# Patient Record
Sex: Male | Born: 1965 | Race: White | Hispanic: No | Marital: Married | Smoking: Former smoker
Health system: Southern US, Community
[De-identification: ages and names within clinical notes are randomized; demographics above are authoritative.]

## PROBLEM LIST (undated history)

## (undated) DIAGNOSIS — Z86010 Personal history of colonic polyps: Principal | ICD-10-CM

## (undated) DIAGNOSIS — T7840XA Allergy, unspecified, initial encounter: Secondary | ICD-10-CM

## (undated) DIAGNOSIS — L908 Other atrophic disorders of skin: Secondary | ICD-10-CM

## (undated) DIAGNOSIS — N138 Other obstructive and reflux uropathy: Secondary | ICD-10-CM

## (undated) DIAGNOSIS — K219 Gastro-esophageal reflux disease without esophagitis: Secondary | ICD-10-CM

## (undated) DIAGNOSIS — E559 Vitamin D deficiency, unspecified: Secondary | ICD-10-CM

## (undated) DIAGNOSIS — L918 Other hypertrophic disorders of the skin: Secondary | ICD-10-CM

## (undated) DIAGNOSIS — E785 Hyperlipidemia, unspecified: Secondary | ICD-10-CM

## (undated) DIAGNOSIS — G4730 Sleep apnea, unspecified: Secondary | ICD-10-CM

## (undated) HISTORY — DX: Sleep apnea, unspecified: G47.30

## (undated) HISTORY — DX: Hyperlipidemia, unspecified: E78.5

## (undated) HISTORY — DX: Allergy, unspecified, initial encounter: T78.40XA

## (undated) HISTORY — DX: Other hypertrophic disorders of the skin: L91.8

## (undated) HISTORY — PX: WISDOM TOOTH EXTRACTION: SHX21

## (undated) HISTORY — DX: Benign prostatic hyperplasia with lower urinary tract symptoms: N13.8

## (undated) HISTORY — DX: Vitamin D deficiency, unspecified: E55.9

## (undated) HISTORY — DX: Other atrophic disorders of skin: L90.8

## (undated) HISTORY — DX: Gastro-esophageal reflux disease without esophagitis: K21.9

## (undated) HISTORY — PX: HERNIA REPAIR: SHX51

## (undated) HISTORY — DX: Personal history of colonic polyps: Z86.010

## (undated) HISTORY — PX: NO PAST SURGERIES: SHX2092

---
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
## 2006-07-03 ENCOUNTER — Ambulatory Visit: Payer: Self-pay | Admitting: Pulmonary Disease

## 2006-08-05 ENCOUNTER — Ambulatory Visit: Payer: Self-pay | Admitting: Pulmonary Disease

## 2006-08-05 ENCOUNTER — Ambulatory Visit (HOSPITAL_BASED_OUTPATIENT_CLINIC_OR_DEPARTMENT_OTHER): Admission: RE | Admit: 2006-08-05 | Discharge: 2006-08-05 | Payer: Self-pay | Admitting: Pulmonary Disease

## 2006-08-21 ENCOUNTER — Ambulatory Visit: Payer: Self-pay | Admitting: Pulmonary Disease

## 2007-01-27 ENCOUNTER — Ambulatory Visit (HOSPITAL_BASED_OUTPATIENT_CLINIC_OR_DEPARTMENT_OTHER): Admission: RE | Admit: 2007-01-27 | Discharge: 2007-01-27 | Payer: Self-pay | Admitting: Pulmonary Disease

## 2007-01-27 ENCOUNTER — Ambulatory Visit: Payer: Self-pay | Admitting: Pulmonary Disease

## 2007-02-26 ENCOUNTER — Ambulatory Visit: Payer: Self-pay | Admitting: Pulmonary Disease

## 2011-02-04 NOTE — Assessment & Plan Note (Signed)
[facility]                             PULMONARY OFFICE NOTE   SHISHIR, KRANTZ                     MRN:          [id number]  DATE:02/26/2007                            DOB:          [birth year]    I met Derek Kennedy today in followup after he had undergone his repeat  sleep study.   This was done on Jan 27, 2007 with the use of his oral appliance.  Again  he had an overall apnea-hypopnea index of 8.1 with an oxygen saturation  there of 80%.  The preponderance of his events were seen in supine sleep  during REM sleep.  During other portions of the night when he was not in  supine sleep his breathing was essentially normal.   He says that he feels a symptomatic benefit from his oral appliance, he  is no longer snoring.  He says he sleeps more soundly at night and does  not feel excessively tired during the day.   What I have recommended for him is to continue the use of his oral  appliance as well as continue with positional therapy.  I had also  suggested to him that if his symptoms were to worsen that ENT evaluation  may be of benefit in conjunction with the continued use of his oral  appliance.  I had also raised the possibility of reconsidering CPAP  therapy, but again he is reluctant to do this.   I will follow up with him in approximately 6 months.     Coralyn Helling, MD  Electronically Signed    VS/MedQ  DD: 02/26/2007  DT: 02/26/2007  Job #: [id number]   cc:   Neoma Laming, MD

## 2011-02-07 NOTE — Assessment & Plan Note (Signed)
[facility]                             PULMONARY OFFICE NOTE   [name], [name]                     MRN:          [id number]  DATE:08/20/2006                            DOB:          1966-03-17    I saw Derek Kennedy today in followup after he had undergone his  overnight polysomnogram.   This was done on August 05, 2006, and he was found to have mild  obstructive sleep apnea with an overall apnea/hypopnea index of 8.3, and  an oxygen saturation nadir of 86%.  He did have a significant positional  as well as REM affect to his sleep apnea.   At this time, I reviewed several options for treatment of this including  positional therapy, CPAP therapy, oral appliance, and surgical  intervention.  After reviewing all of these options, Derek Kennedy would  prefer to be fitted for an oral appliance.  I have discussed with him  the procedure for this, particularly with regards to having him undergo  a repeat overnight polysomnogram after he is fitted with his oral  appliance to document efficacy of this form of therapy.  Additionally, I  have also discussed with him that there may be some difficulties as far  as insurance covering for his appliance, and with this understanding, he  is still agreeable to undergo evaluation for an oral appliance.   Therefore, I will make arrangements for him to be referred to Dr. Neoma Laming and to have followup with me in approximately 3 months, at which  time, depending upon his symptoms, that is, I could determine when he  should have his repeat overnight polysomnogram.     Coralyn Helling, MD  Electronically Signed    VS/MedQ  DD: 08/21/2006  DT: 08/22/2006  Job #: [id number]

## 2011-02-07 NOTE — Assessment & Plan Note (Signed)
[facility]                               PULMONARY OFFICE NOTE   DIAGO, HAIK                     MRN:          [id number]  DATE:07/03/2006                            DOB:          Feb 08, 1966    REFERRING PHYSICIAN:  Self Referral   Mr. Derek Kennedy is a 45 year old male who had recently gotten married, and his  wife had noted that he had difficulties with snoring.  She was also  concerned that he would actually seem like he stops breathing at times while  he is asleep.  He says that his typical sleep pattern is that he will go to  bed around 11 o'clock at night, although he is sometimes falling asleep  prior to this while watching TV.  He falls asleep fairly quickly, but then  he wakes up early in the morning and has difficulty falling back to sleep.  He is not really sure what is waking him up.  He will get up out of bed at  about 5:30 in the morning, but still feels quite tired, although he denies  having any headaches.  He does tend to breathe more through his mouth at  night, and gets a dry mouth as well.  He says that if he tries sleeping on  his back, he notices he has difficulty with his breathing.  He drinks about  3 cups of coffee in the morning, but he is not using anything to help him  fall asleep at night.  There is no history of sleepwalking, sleeptalking,  nightmares, night terrors, sleep hallucinations, sleep paralysis, cataplexy.  He also denies symptoms of restless leg syndrome.  His Epworth score today  is 11 out of 24.  He has not had any difficulty with his driving related to  feeling sleepy.  He takes a nap for approximately 30 minutes to an hour at  least once or twice per week.   PAST MEDICAL HISTORY:  Essentially negative.   PAST SURGICAL HISTORY:  Essentially negative.   MEDICATIONS:  He is not currently on any medications.   ALLERGIES:  He has no known drug allergies.   FAMILY HISTORY:  Significant for his  father with heart disease and an uncle  who had lung cancer.   SOCIAL HISTORY:  He is married.  He uses smokeless tobacco.  He has  occasional alcohol use.  He is a Radio producer, but currently  unemployed.   REVIEW OF SYSTEMS:  He has gained approximately 5 pounds over the last one  year.   PHYSICAL EXAMINATION:  He is 6 feet 2 inches tall, weight is 181 pounds.  Temperature is 97.7, blood pressure is 100/62, heart rate is 61.  Oxygen  saturation is 100% on room air.  HEENT:  Pupils are equal and reactive.  There is no sinus tenderness.  He  has a narrow nasal angle as well as alar collapse with inhalation.  He is  mildly retrognathic with a slight overbite.  He has a Mallampati II airway  with oropharyngeal crowding and elongated and boggy uvula.  There is no  lymphadenopathy, no thyromegaly.  HEART:  S1 and S2, regular rhythm.  CHEST:  Clear to auscultation.  ABDOMEN:  Soft, nontender, positive bowel sounds.  EXTREMITIES:  There was no edema, cyanosis or clubbing.  NEUROLOGIC:  There are no focal deficits noted.   IMPRESSION:  Snoring with sleep disruption and daytime hypersomnolence.  I  would be concerned that he, in fact, has some degree of obstructive sleep  apnea, particularly given his upper airway findings.  To further evaluate  this, I do feel that it would be warranted for him to undergo an overnight  polysomnogram, and then depending upon the results of this, if in fact he  has sleep apnea, further therapeutic interventions would be decided,  including possibly oral appliance, surgical intervention or CPAP therapy.  In the meantime, I discussed with him the importance of maintaining his  weight as well as avoidance of alcohol and sedatives.  Driving precautions  were discussed with him as well, and the adverse consequences of sleep apnea  were detailed to him as well.       Coralyn Helling, MD      VS/MedQ  DD:  07/03/2006  DT:  07/05/2006  Job #:   [id number]

## 2011-02-07 NOTE — Procedures (Signed)
NAMESKYY, MCKNIGHT            ACCOUNT NO.:  [id number]   MEDICAL RECORD NO.:  [id number]          PATIENT TYPE:  OUT   LOCATION:  SLEEP CENTER                 FACILITY[facility]  [facility]   PHYSICIAN:  Coralyn Helling, MD        DATE OF BIRTH:  1966-04-08   DATE OF STUDY:  08/05/2006                              NOCTURNAL POLYSOMNOGRAM   REFERRING PHYSICIAN:  Dr. Coralyn Helling   INDICATION FOR STUDY:  This individual who has witnessed apneas as well as  symptoms of excessive daytime sleepiness. He is referred to the Sleep Lab  for evaluation of hypersomnia with obstructive sleep apnea.   EPWORTH SLEEPINESS SCORE:  11.   MEDICATIONS:  None.   SLEEP ARCHITECTURE:  Total recording time was 403 minutes; total sleep time  was 377 minutes. Sleep efficiency was 94%. Sleep latency was 12 minutes, REM  latency was 82 minutes. The study was notable for the lack of slow wave  sleep.  The patient slept in both the supine and nonsupine positions.   RESPIRATORY DATA:  The average respiratory rate was 16. The overall apnea-  hypopnea index was 8.3. The events were exclusively obstructive in nature.  Moderate snoring was noted by the technician. The nonsupine apnea-hypopnea  index was 0.3. The supine apnea-hypopnea index was 17.3. The non-REM apnea-  hypopnea index was 3.7. The REM apnea-hypopnea index was 24.6.   OXYGEN DATA:  The baseline oxygenation was 95%. The oxygen saturation nadir  was 86%. The patient spent a total of 399.5 minutes with an oxygen  saturation between 91-100% and 2.6 minutes with an oxygen saturation between  81-90%.   CARDIAC DATA:  The average heart rate was 67 and the rhythm strip showed  sinus bradycardia.   MOVEMENT-PARASOMNIA:  The period limb movement index was 0. The patient was  reading a book prior to going to sleep. No other abnormal behaviors were  noted during sleep.   IMPRESSIONS-RECOMMENDATIONS:  This was a split-night protocol however the  patient did not meet  protocol procedures. He was found to have an apnea-  hypopnea index of 8.3 with oxygen saturation nadir of 86% consistent with  mild to moderate obstructive sleep apnea and a diagnosis of hypersomnia with  obstructive sleep apnea. He did have a significant positional as well as  rapid eye movement (REM) effect to his sleep apnea. At this  time consideration should be given to having the patient undergo either  continuous positive airway pressure therapy, oral appliance or a surgical  intervention given the degree of his daytime symptoms.      Coralyn Helling, MD  Diplomat, American Board of Sleep Medicine  Electronically Signed     VS/MEDQ  D:  08/17/2006 12:55:42  T:  08/17/2006 15:10:07  Job:  [id number]

## 2011-02-07 NOTE — Procedures (Signed)
NAMEKRISTOFF, Derek Kennedy            ACCOUNT NO.:  [id number]   MEDICAL RECORD NO.:  [id number]          PATIENT TYPE:  OUT   LOCATION:  SLEEP CENTER                 [facility]ACILITY[facility]   PHYSICIAN:  Coralyn Helling, MD        DATE OF BIRTH:  [birth year]   DATE OF STUDY:  01/27/2007                            NOCTURNAL POLYSOMNOGRAM   REFERRING PHYSICIAN:  Coralyn Helling, MD   INDICATIONS FOR PROCEDURE:  This is an individual who had undergone an  overnight polysomnogram on August 05, 2006, which showed an  obstructive apnea/hypopnea index of 8.3 and O2 saturation of 83%.  He  has since been fitted with an oral appliance and he is referred back to  the sleep lab for evaluation of the efficacy of his oral appliance.  Epworth score is 9.   MEDICATIONS:  None.   SLEEP ARCHITECTURE:  Total recording time was 437 minutes.  Total sleep  time was 387 minutes.  Sleep efficiency was 88%.  Sleep latency was 18.5  minutes.  REM latency was 96.5 minutes.  The study was notable for lack  of slow wave sleep.  Otherwise the patient was observed in all other  stages of sleep.  The patient slept in both the supine and nonsupine  position.   RESPIRATORY DATA:  The average respiratory rate was 16.  The overall  apnea/hypopnea index was 8.1.  The events were exclusively obstructive  in nature.  The nonsupine apnea/hypopnea index was 0.  The supine  apnea/hypopnea index was 14.7.  The non-REM apnea/hypopnea index was  0.4.  The REM apnea/hypopnea index was 28.4.  Mild snoring was noted by  the technician.   OXYGEN DATA:  The baseline oxygenation was 95%.  The oxygen saturation  was 88%. The patient spent a total of 2.2 minutes with an oxygen  saturation between 81 and 90%.  The remainder of the test was spent with  an oxygen saturation greater than 91%.   CARDIAC DATA:  The average heart rate was 60 and the rhythm strip showed  normal sinus rhythm.   MOVEMENT AND PARASOMNIA:  The periodic limb movement  index was 0 and the  patient had one rest room trip.   IMPRESSION:  This study was done with the patient using his oral  appliance.  His overall apnea/hypopnea index was 8.1 with an oxygen  saturation rate of 88%.  However, his events were exclusively seen  during REM sleep in the supine position.  During the remainder of the  test time when he was in the nonsupine position and when he was in non-  REM sleep, he did not have evidence for sleep disordered breathing.  At  this time I would recommend that the patient continue the use of his  oral appliance and further discussion should be had with him regarding  positional  therapy.  If he is still having difficulties after this then he may need  to have alternative forms of therapy for his sleep apnea.      Coralyn Helling, MD  Diplomat, American Board of Sleep Medicine  Electronically Signed     VS/MEDQ  D:  02/07/2007 08:53:58  T:  02/07/2007 21:53:17  Job:  [id number]   cc:   Neoma Laming, M.D.

## 2012-04-22 ENCOUNTER — Emergency Department (HOSPITAL_BASED_OUTPATIENT_CLINIC_OR_DEPARTMENT_OTHER)
Admission: EM | Admit: 2012-04-22 | Discharge: 2012-04-22 | Disposition: A | Discharge status: Home / Self Care | Payer: BC Managed Care – PPO | Attending: Emergency Medicine | Admitting: Emergency Medicine

## 2012-04-22 ENCOUNTER — Emergency Department (HOSPITAL_BASED_OUTPATIENT_CLINIC_OR_DEPARTMENT_OTHER): Payer: BC Managed Care – PPO

## 2012-04-22 ENCOUNTER — Encounter (HOSPITAL_BASED_OUTPATIENT_CLINIC_OR_DEPARTMENT_OTHER): Payer: Self-pay | Admitting: *Deleted

## 2012-04-22 DIAGNOSIS — R0789 Other chest pain: Secondary | ICD-10-CM | POA: Insufficient documentation

## 2012-04-22 LAB — CBC WITH DIFFERENTIAL/PLATELET
Basophils Absolute: 0 10*3/uL (ref 0.0–0.1)
Basophils Relative: 0 % (ref 0–1)
MCHC: 35.3 g/dL (ref 30.0–36.0)
Neutro Abs: 6.8 10*3/uL (ref 1.7–7.7)
Neutrophils Relative %: 66 % (ref 43–77)
RDW: 12.5 % (ref 11.5–15.5)
WBC: 10.4 10*3/uL (ref 4.0–10.5)

## 2012-04-22 LAB — CARDIAC PANEL(CRET KIN+CKTOT+MB+TROPI)
CK, MB: 2.7 ng/mL (ref 0.3–4.0)
Relative Index: 1.8 (ref 0.0–2.5)
Total CK: 150 U/L (ref 7–232)
Troponin I: <0.3 ng/mL (ref <0.30)

## 2012-04-22 LAB — BASIC METABOLIC PANEL
Chloride: 104 mEq/L (ref 96–112)
Creatinine, Ser: 1.3 mg/dL (ref 0.50–1.35)
GFR calc Af Amer: 75 mL/min — ABNORMAL LOW (ref >90)
Potassium: 3.7 mEq/L (ref 3.5–5.1)
Sodium: 141 mEq/L (ref 135–145)

## 2012-04-22 NOTE — ED Notes (Addendum)
Pt reports with sharp midsternal chest pain since Tuesday. Pt reports the pain is not ever really severe and yet is doesn't ever go away. Does not radiate. Pain occurs at rest. The pain fluctuates but never completely resolves. Also reports increasing shortness of breath. Denies diaphoresis with the episodes. Reports nausea today.

## 2012-04-22 NOTE — ED Provider Notes (Signed)
History     CSN: [id number]  Arrival date & time 04/22/12  1257   First MD Initiated Contact with Patient 04/22/12 1321      Chief Complaint  Patient presents with  . Chest Pain    (Consider location/radiation/quality/duration/timing/severity/associated sxs/prior treatment) HPI Comments: Patient has been having intermittent left-sided chest pain for the past 6 weeks. At last a few seconds at a time it does not radiate. It is associated with a total body tingling feeling. An episode 2 days ago that lasted longer than usual, about 30 seconds and he has had an aching soreness in his chest since. This pain is never resolved. He denies any current nausea, vomiting, shortness of breath, diaphoresis, abdominal pain. He has no previous cardiac history. The pain he has been having over the past several days identical to pains and having intermittently over the past 6 weeks.  The history is provided by the patient.    History reviewed. No pertinent past medical history.  History reviewed. No pertinent past surgical history.  History reviewed. No pertinent family history.  History  Substance Use Topics  . Smoking status: Never Smoker   . Smokeless tobacco: Not on file  . Alcohol Use: Yes     occ      Review of Systems  Constitutional: Negative for fever and activity change.  HENT: Negative for congestion and rhinorrhea.   Respiratory: Positive for chest tightness. Negative for cough.   Cardiovascular: Positive for chest pain.  Gastrointestinal: Negative for nausea, vomiting and abdominal pain.  Genitourinary: Negative for dysuria, genital sores and testicular pain.  Musculoskeletal: Negative for back pain.  Skin: Negative for rash.  Neurological: Negative for dizziness, weakness, light-headedness and headaches.    Allergies  Review of patient's allergies indicates no known allergies.  Home Medications  No current outpatient prescriptions on file.  BP 161/90  Pulse 94  Temp  98.4 F (36.9 C) (Oral)  Resp 20  Ht 6\' 2"  (1.88 m)  Wt 200 lb (90.719 kg)  BMI 25.68 kg/m2  SpO2 98%  Physical Exam  Constitutional: He is oriented to person, place, and time. He appears well-developed and well-nourished. No distress.  HENT:  Head: Normocephalic and atraumatic.  Mouth/Throat: Oropharynx is clear and moist. No oropharyngeal exudate.  Eyes: Conjunctivae are normal. Pupils are equal, round, and reactive to light.  Neck: Normal range of motion. Neck supple.  Cardiovascular: Normal rate, regular rhythm and normal heart sounds.   Pulmonary/Chest: Effort normal and breath sounds normal. No respiratory distress.  Abdominal: Soft. There is no tenderness. There is no rebound and no guarding.  Musculoskeletal: Normal range of motion. He exhibits no edema and no tenderness.  Neurological: He is alert and oriented to person, place, and time. No cranial nerve deficit.  Skin: Skin is warm.    ED Course  Procedures (including critical care time)  Labs Reviewed  BASIC METABOLIC PANEL - Abnormal; Notable for the following:    Glucose, Bld 100 (*)     BUN 24 (*)     GFR calc non Af Amer 64 (*)     GFR calc Af Amer 75 (*)     All other components within normal limits  CBC WITH DIFFERENTIAL  CARDIAC PANEL(CRET KIN+CKTOT+MB+TROPI)  D-DIMER, QUANTITATIVE   Dg Chest 2 View  04/22/2012  *RADIOLOGY REPORT*  Clinical Data:  Chest pain  CHEST - 2 VIEW  Comparison: None.  Findings: The lungs are well-aerated.  Negative for edema, focal airspace consolidation, or  pulmonary nodule.  No thorax, or pleural effusion.  The bony thorax is intact.  No acute osseous finding. Cardiac and mediastinal silhouettes within normal limits.  IMPRESSION:  1.  No acute cardiopulmonary disease. 2.  Normal chest.  Original Report Authenticated By: HEATH     1. Atypical chest pain       MDM  Intermittent atypical chest pain for the past several weeks. No shortness breath, diaphoresis, nausea. Constant  soreness in chest for the past 2 days after 30 second episode of chest tingling on Tuesday.  CXR negative.  D-dimer negative, troponin negative.  Getting ongoing nature the pain for the past 2 days, feel ACS ruled out with a single troponin. Intermittent fleeting episodes of chest pain lasting a few seconds at a time are not typical for ACS.  Stable for outpatient stress testing with PCP.  Patient agrees.    Date: 04/22/2012  Rate: 958  Rhythm: sinus arrhythmia  QRS Axis: normal  Intervals: normal  ST/T Wave abnormalities: normal  Conduction Disutrbances:none  Narrative Interpretation:   Old EKG Reviewed: none available    Glynn Octave, MD 04/22/12 1436

## 2012-04-27 ENCOUNTER — Other Ambulatory Visit: Payer: Self-pay | Admitting: Family Medicine

## 2012-12-01 DIAGNOSIS — E559 Vitamin D deficiency, unspecified: Secondary | ICD-10-CM | POA: Insufficient documentation

## 2012-12-01 DIAGNOSIS — L908 Other atrophic disorders of skin: Secondary | ICD-10-CM | POA: Insufficient documentation

## 2012-12-08 ENCOUNTER — Ambulatory Visit: Payer: BC Managed Care – PPO | Admitting: Family Medicine

## 2012-12-10 ENCOUNTER — Ambulatory Visit (INDEPENDENT_AMBULATORY_CARE_PROVIDER_SITE_OTHER): Payer: Self-pay | Admitting: Family Medicine

## 2012-12-10 ENCOUNTER — Encounter: Payer: Self-pay | Admitting: Family Medicine

## 2012-12-10 ENCOUNTER — Telehealth: Payer: Self-pay | Admitting: *Deleted

## 2012-12-10 VITALS — BP 115/84 | HR 84 | Wt 211.0 lb

## 2012-12-10 DIAGNOSIS — E785 Hyperlipidemia, unspecified: Secondary | ICD-10-CM

## 2012-12-10 DIAGNOSIS — E559 Vitamin D deficiency, unspecified: Secondary | ICD-10-CM

## 2012-12-10 MED ORDER — ROSUVASTATIN CALCIUM 40 MG PO TABS: 40.0000 mg | ORAL_TABLET | Freq: Every day | ORAL | Status: DC

## 2012-12-10 NOTE — Patient Instructions (Addendum)
1)  Start on the Crestor 20 mg per day.  Check with your insurance to see what the cost will be.  Let us know if it is going to be over $18.  We may have to try a generic alone first.    2)  Start on Vitamin D3 5000 IU daily.    Cholesterol Cholesterol is a white, waxy, fat-like protein needed by your body in small amounts. The liver makes all the cholesterol you need. It is carried from the liver by the blood through the blood vessels. Deposits (plaque) may build up on blood vessel walls. This makes the arteries narrower and stiffer. Plaque increases the risk for heart attack and stroke. You cannot feel your cholesterol level even if it is very high. The only way to know is by a blood test to check your lipid (fats) levels. Once you know your cholesterol levels, you should keep a record of the test results. Work with your caregiver to to keep your levels in the desired range. WHAT THE RESULTS MEAN:  Total cholesterol is a rough measure of all the cholesterol in your blood.  LDL is the so-called bad cholesterol. This is the type that deposits cholesterol in the walls of the arteries. You want this level to be low.  HDL is the good cholesterol because it cleans the arteries and carries the LDL away. You want this level to be high.  Triglycerides are fat that the body can either burn for energy or store. High levels are closely linked to heart disease. DESIRED LEVELS:  Total cholesterol below 200.  LDL below 100 for people at risk, below 70 for very high risk.  HDL above 50 is good, above 60 is best.  Triglycerides below 150. HOW TO LOWER YOUR CHOLESTEROL:  Diet.  Choose fish or white meat chicken and [place], roasted or baked. Limit fatty cuts of red meat, fried foods, and processed meats, such as sausage and lunch meat.  Eat lots of fresh fruits and vegetables. Choose whole grains, beans, pasta, potatoes and cereals.  Use only small amounts of olive, corn or canola oils. Avoid  butter, mayonnaise, shortening or palm kernel oils. Avoid foods with trans-fats.  Use skim/nonfat milk and low-fat/nonfat yogurt and cheeses. Avoid whole milk, cream, ice cream, egg yolks and cheeses. Healthy desserts include angel food cake, ginger snaps, animal crackers, hard candy, popsicles, and low-fat/nonfat frozen yogurt. Avoid pastries, cakes, pies and cookies.  Exercise.  A regular program helps decrease LDL and raises HDL.  Helps with weight control.  Do things that increase your activity level like gardening, walking, or taking the stairs.  Medication.  May be prescribed by your caregiver to help lowering cholesterol and the risk for heart disease.  You may need medicine even if your levels are normal if you have several risk factors. HOME CARE INSTRUCTIONS   Follow your diet and exercise programs as suggested by your caregiver.  Take medications as directed.  Have blood work done when your caregiver feels it is necessary. MAKE SURE YOU:   Understand these instructions.  Will watch your condition.  Will get help right away if you are not doing well or get worse. Document Released: 06/03/2001 Document Revised: 12/01/2011 Document Reviewed: 11/24/2007 [facility] Patient Information 2013 [place], [place].

## 2012-12-10 NOTE — Progress Notes (Signed)
Patient ID: Derek Kennedy, male   DOB: June 22, 1966, 47 y.o.   MRN: [id number] Chief Complaint  Patient presents with  . Hyperlipidemia     HPI Derek Kennedy is here today to go over his most recent lab results.  He has done well since his last office visit. He would like to get his medications refilled. He never did get the [place] filled from [place].  He is currently paying $80 per month for it.    Past Medical History  Diagnosis Date  . Vitamin D deficiency   . GERD (gastroesophageal reflux disease)   . 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 Hyperlipidemia    No past surgical history on file. Family History  Problem Relation Age of Onset  . Hypertension Mother   . Vascular Disease Father   . Vascular Disease Other     Social History History  Substance Use Topics  . Smoking status: Never Smoker   . Smokeless tobacco: Former Neurosurgeon  . Alcohol Use: Yes     Comment: occ    Current Outpatient Prescriptions  Medication Sig Dispense Refill  . ezetimibe (ZETIA) 10 MG tablet Take 10 mg by mouth daily.      . pravastatin (PRAVACHOL) 20 MG tablet Take 20 mg by mouth daily.      . ranitidine (ZANTAC) 75 MG tablet Take 75 mg by mouth 2 (two) times daily.      . rosuvastatin (CRESTOR) 40 MG tablet Take 1 tablet (40 mg total) by mouth daily.  30 tablet  5   No current facility-administered medications for this visit.   No Known Allergies  Review of Systems  Cardiovascular: Negative for chest pain.  Gastrointestinal: Negative for abdominal pain (takes ranitidine as needed ).  Musculoskeletal: Negative for myalgias and arthralgias.   BP 115/84  Pulse 84  Wt 211 lb (95.709 kg)  BMI 27.99 kg/m2 Physical Exam  Cardiovascular: Normal rate, regular rhythm and normal heart sounds.   Pulmonary/Chest: Effort normal and breath sounds normal.  Musculoskeletal: Normal range of motion. He exhibits no edema and no tenderness.  Psychiatric: He has a normal mood and affect.     Assessment: Hyperlipidemia Vitamin D Deficiency GERD  Plan:  1)  We discussed the cost of his current cholesterol medication.   He is to check into the cost of Crestor to see if he can get it for $18.  He will let us know.  If not, we may try him on another one of the generic statins.    2)  He is to take OTC Vitamin D3 5,000 IU daily.  3)  He has not been taking Protonix.  He only takes the ranitidine prn GERD symptoms.

## 2013-06-09 ENCOUNTER — Ambulatory Visit (INDEPENDENT_AMBULATORY_CARE_PROVIDER_SITE_OTHER): Payer: No Typology Code available for payment source | Admitting: Family Medicine

## 2013-06-09 ENCOUNTER — Encounter: Payer: Self-pay | Admitting: Family Medicine

## 2013-06-09 VITALS — BP 118/85 | HR 93 | Resp 16 | Ht 74.0 in | Wt 210.0 lb

## 2013-06-09 DIAGNOSIS — N529 Male erectile dysfunction, unspecified: Secondary | ICD-10-CM

## 2013-06-09 DIAGNOSIS — K219 Gastro-esophageal reflux disease without esophagitis: Secondary | ICD-10-CM

## 2013-06-09 DIAGNOSIS — Z23 Encounter for immunization: Secondary | ICD-10-CM

## 2013-06-09 DIAGNOSIS — R059 Cough, unspecified: Secondary | ICD-10-CM

## 2013-06-09 DIAGNOSIS — R05 Cough: Secondary | ICD-10-CM

## 2013-06-09 MED ORDER — HYDROCOD POLST-CHLORPHEN POLST 10-8 MG/5ML PO LQCR: 5.0000 mL | Freq: Two times a day (BID) | ORAL | Status: DC | PRN

## 2013-06-09 MED ORDER — BENZONATATE 200 MG PO CAPS: 200.0000 mg | ORAL_CAPSULE | Freq: Three times a day (TID) | ORAL | Status: DC | PRN

## 2013-06-09 MED ORDER — VARDENAFIL HCL 10 MG PO TABS: 10.0000 mg | ORAL_TABLET | Freq: Every day | ORAL | Status: DC | PRN

## 2013-06-09 MED ORDER — TADALAFIL 20 MG PO TABS: ORAL_TABLET | ORAL | Status: DC

## 2013-06-09 MED ORDER — SILDENAFIL CITRATE 20 MG PO TABS: ORAL_TABLET | ORAL | Status: DC

## 2013-06-09 MED ORDER — RANITIDINE HCL 150 MG PO TABS
150.0000 mg | ORAL_TABLET | Freq: Two times a day (BID) | ORAL | Status: DC
Start: 2013-06-09 — End: 2015-10-12

## 2013-06-09 NOTE — Progress Notes (Signed)
Subjective:    Patient ID: Derek Kennedy, male    DOB: 12/28/1965, 47 y.o.   MRN: [id number]  HPI  Derek Kennedy is here today to discuss the conditions listed below:   1)  Hyperlipidemia:  He has been doing well on his Crestor.  He needs a refill on it.    2)  URI:  He has been having URI symptoms for about three days.  He has been taking Claritin D, Mucinex and other OTC medications which have not helped him very much.     3)  ED:  He has had some trouble with ED and would like to try a medication for this.     Review of Systems  Constitutional: Negative.   HENT: Positive for rhinorrhea.   Eyes: Negative.   Respiratory: Positive for cough.   Cardiovascular: Negative.   Gastrointestinal: Negative.   Endocrine: Negative.   Musculoskeletal: Negative.   Skin: Negative.   Allergic/Immunologic: Negative.   Neurological: Positive for headaches.  Hematological: Negative.   Psychiatric/Behavioral: Negative.      Past Medical History  Diagnosis Date  . Vitamin D deficiency   . GERD (gastroesophageal reflux disease)   . 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 Hyperlipidemia      Family History  Problem Relation Age of Onset  . Hypertension Mother   . Heart disease Father   . Vascular Disease Other      History   Social History Narrative   Marital Status: Married Marland Kitchen)   Children:  Doreene Adas [facility]); Stepdaughter Delorise Jackson)    Pets:  None    Living Situation: Lives with spouse and step-children.   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  He    is currently enrolled at [employer].  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  He would like to then continue his education at [employer] and get a MBA.     Tobacco Use/Exposure:  He currently does not use any tobacco products.  He has used smokeless tobacco in the past but quit in 2009 with the help of  Chantix.    Alcohol Use:  He rarely drinks during the week.  If he drinks it is usually on the weekends.   Drug Use:  None   Diet:  Regular   Exercise:  Yardwork    Hobbies: Golf/ Water Skiing/ [employer] Working                Objective:   Physical Exam  Vitals reviewed. Constitutional: He is oriented to person, place, and time. He appears well-developed and well-nourished. No distress.  HENT:  Head: Normocephalic.  Eyes: No scleral icterus.  Neck: Neck supple. No thyromegaly present.  Cardiovascular: Normal rate, regular rhythm and normal heart sounds.   Pulmonary/Chest: Effort normal and breath sounds normal.  Musculoskeletal: Normal range of motion. He exhibits no edema.  Neurological: He is alert and oriented to person, place, and time.  Skin: Skin is warm and dry. No rash noted.  Psychiatric: He has a normal mood and affect. His behavior is normal. Judgment and thought content normal.      Assessment & Plan:    Derek Kennedy was seen today for medication management and uri.  Diagnoses and associated orders for this visit:  Need for prophylactic vaccination and inoculation against influenza - Flu Vaccine QUAD 36+ mos PF IM (Fluarix)  GERD (gastroesophageal  reflux disease) - ranitidine (ZANTAC) 150 MG tablet; Take 1 tablet (150 mg total) by mouth 2 (two) times daily.  Erectile dysfunction - vardenafil (LEVITRA) 10 MG tablet; Take 1 tablet (10 mg total) by mouth daily as needed for erectile dysfunction. - tadalafil (CIALIS) 20 MG tablet; Take 1 tab every 3 days as needed for ED - sildenafil (REVATIO) 20 MG tablet; Take 2-5 tabs as needed for ED  Cough - benzonatate (TESSALON) 200 MG capsule; Take 1 capsule (200 mg total) by mouth 3 (three) times daily as needed for cough. - chlorpheniramine-HYDROcodone (TUSSIONEX PENNKINETIC ER) 10-8 MG/5ML LQCR; Take 5 mLs by mouth every 12 (twelve) hours as needed.

## 2013-08-21 DIAGNOSIS — K219 Gastro-esophageal reflux disease without esophagitis: Secondary | ICD-10-CM

## 2013-08-21 DIAGNOSIS — Z23 Encounter for immunization: Secondary | ICD-10-CM | POA: Insufficient documentation

## 2013-08-21 DIAGNOSIS — N529 Male erectile dysfunction, unspecified: Secondary | ICD-10-CM | POA: Insufficient documentation

## 2013-08-21 DIAGNOSIS — R059 Cough, unspecified: Secondary | ICD-10-CM

## 2013-08-21 DIAGNOSIS — R05 Cough: Secondary | ICD-10-CM | POA: Insufficient documentation

## 2013-08-21 HISTORY — DX: Encounter for immunization: Z23

## 2013-08-21 HISTORY — DX: Cough, unspecified: R05.9

## 2013-08-21 HISTORY — DX: Gastro-esophageal reflux disease without esophagitis: K21.9

## 2013-08-21 NOTE — Assessment & Plan Note (Signed)
He received a flu shot without difficulty.  He was given a handout discussing possible side effects.

## 2013-09-28 NOTE — Telephone Encounter (Signed)
ERROR

## 2013-11-29 ENCOUNTER — Other Ambulatory Visit: Payer: Self-pay | Admitting: *Deleted

## 2013-11-29 DIAGNOSIS — R5383 Other fatigue: Principal | ICD-10-CM

## 2013-11-29 DIAGNOSIS — E785 Hyperlipidemia, unspecified: Secondary | ICD-10-CM

## 2013-11-29 DIAGNOSIS — R5381 Other malaise: Secondary | ICD-10-CM

## 2013-11-30 ENCOUNTER — Other Ambulatory Visit: Payer: No Typology Code available for payment source

## 2013-11-30 LAB — COMPREHENSIVE METABOLIC PANEL
ALT: 28 U/L (ref 0–53)
AST: 24 U/L (ref 0–37)
Albumin: 4.4 g/dL (ref 3.5–5.2)
Alkaline Phosphatase: 36 U/L — ABNORMAL LOW (ref 39–117)
BUN: 25 mg/dL — ABNORMAL HIGH (ref 6–23)
CO2: 23 mEq/L (ref 19–32)
Calcium: 9.5 mg/dL (ref 8.4–10.5)
Chloride: 108 mEq/L (ref 96–112)
Creat: 1.32 mg/dL (ref 0.50–1.35)
Glucose, Bld: 92 mg/dL (ref 70–99)
Potassium: 4.3 mEq/L (ref 3.5–5.3)
Sodium: 141 mEq/L (ref 135–145)
Total Bilirubin: 0.4 mg/dL (ref 0.2–1.2)
Total Protein: 7 g/dL (ref 6.0–8.3)

## 2013-11-30 LAB — CBC WITH DIFFERENTIAL/PLATELET
Basophils Absolute: 0 10*3/uL (ref 0.0–0.1)
Basophils Relative: 0 % (ref 0–1)
Eosinophils Absolute: 0.3 10*3/uL (ref 0.0–0.7)
Eosinophils Relative: 4 % (ref 0–5)
HCT: 41.7 % (ref 39.0–52.0)
Hemoglobin: 14.6 g/dL (ref 13.0–17.0)
Lymphocytes Relative: 37 % (ref 12–46)
Lymphs Abs: 2.5 10*3/uL (ref 0.7–4.0)
MCH: 30.4 pg (ref 26.0–34.0)
MCHC: 35 g/dL (ref 30.0–36.0)
MCV: 86.7 fL (ref 78.0–100.0)
Monocytes Absolute: 0.6 10*3/uL (ref 0.1–1.0)
Monocytes Relative: 9 % (ref 3–12)
Neutro Abs: 3.4 10*3/uL (ref 1.7–7.7)
Neutrophils Relative %: 50 % (ref 43–77)
Platelets: 315 10*3/uL (ref 150–400)
RBC: 4.81 MIL/uL (ref 4.22–5.81)
RDW: 13.5 % (ref 11.5–15.5)
WBC: 6.7 10*3/uL (ref 4.0–10.5)

## 2013-11-30 LAB — LIPID PANEL
Cholesterol: 135 mg/dL (ref 0–200)
HDL: 41 mg/dL (ref >39)
LDL Cholesterol: 64 mg/dL (ref 0–99)
Total CHOL/HDL Ratio: 3.3 Ratio
Triglycerides: 152 mg/dL — ABNORMAL HIGH (ref <150)
VLDL: 30 mg/dL (ref 0–40)

## 2013-11-30 LAB — TSH: TSH: 1.412 u[IU]/mL (ref 0.350–4.500)

## 2013-12-07 ENCOUNTER — Encounter: Payer: Self-pay | Admitting: Family Medicine

## 2013-12-07 ENCOUNTER — Ambulatory Visit (INDEPENDENT_AMBULATORY_CARE_PROVIDER_SITE_OTHER): Payer: No Typology Code available for payment source | Admitting: Family Medicine

## 2013-12-07 VITALS — BP 109/77 | HR 74 | Resp 16 | Wt 215.0 lb

## 2013-12-07 DIAGNOSIS — E559 Vitamin D deficiency, unspecified: Secondary | ICD-10-CM

## 2013-12-07 DIAGNOSIS — Z125 Encounter for screening for malignant neoplasm of prostate: Secondary | ICD-10-CM

## 2013-12-07 DIAGNOSIS — R5383 Other fatigue: Secondary | ICD-10-CM

## 2013-12-07 DIAGNOSIS — K219 Gastro-esophageal reflux disease without esophagitis: Secondary | ICD-10-CM

## 2013-12-07 DIAGNOSIS — R5381 Other malaise: Secondary | ICD-10-CM

## 2013-12-07 DIAGNOSIS — E785 Hyperlipidemia, unspecified: Secondary | ICD-10-CM

## 2013-12-07 DIAGNOSIS — N529 Male erectile dysfunction, unspecified: Secondary | ICD-10-CM

## 2013-12-07 MED ORDER — ROSUVASTATIN CALCIUM 20 MG PO TABS: ORAL_TABLET | ORAL | Status: DC

## 2013-12-07 MED ORDER — SILDENAFIL CITRATE 20 MG PO TABS: ORAL_TABLET | ORAL | Status: AC

## 2013-12-07 MED ORDER — RABEPRAZOLE SODIUM 20 MG PO TBEC: 20.0000 mg | DELAYED_RELEASE_TABLET | Freq: Every day | ORAL | Status: DC

## 2013-12-07 MED ORDER — SILDENAFIL CITRATE 20 MG PO TABS: ORAL_TABLET | ORAL | Status: DC

## 2013-12-07 NOTE — Patient Instructions (Addendum)
1)  Cholesterol - Your LDL is better than it needs to be so we'll lower your dosage to 10 mg.  To lower the triglycerides you can decrease your carbs/fatty foods and/or add a Fish Oil.    2)  ED - Contact [facility] for your medication.    Hypertriglyceridemia  Diet for High blood levels of Triglycerides Most fats in food are triglycerides. Triglycerides in your blood are stored as fat in your body. High levels of triglycerides in your blood may put you at a greater risk for heart disease and stroke.  Normal triglyceride levels are less than 150 mg/dL. Borderline high levels are 150-199 mg/dl. High levels are 200 - 499 mg/dL, and very high triglyceride levels are greater than 500 mg/dL. The decision to treat high triglycerides is generally based on the level. For people with borderline or high triglyceride levels, treatment includes weight loss and exercise. Drugs are recommended for people with very high triglyceride levels. Many people who need treatment for high triglyceride levels have metabolic syndrome. This syndrome is a collection of disorders that often include: insulin resistance, high blood pressure, blood clotting problems, high cholesterol and triglycerides. TESTING PROCEDURE FOR TRIGLYCERIDES  You should not eat 4 hours before getting your triglycerides measured. The normal range of triglycerides is between 10 and 250 milligrams per deciliter (mg/dl). Some people may have extreme levels (1000 or above), but your triglyceride level may be too high if it is above 150 mg/dl, depending on what other risk factors you have for heart disease.  People with high blood triglycerides may also have high blood cholesterol levels. If you have high blood cholesterol as well as high blood triglycerides, your risk for heart disease is probably greater than if you only had high triglycerides. High blood cholesterol is one of the main risk factors for heart disease. CHANGING YOUR DIET  Your weight can  affect your blood triglyceride level. If you are more than 20% above your ideal body weight, you may be able to lower your blood triglycerides by losing weight. Eating less and exercising regularly is the best way to combat this. Fat provides more calories than any other food. The best way to lose weight is to eat less fat. Only 30% of your total calories should come from fat. Less than 7% of your diet should come from saturated fat. A diet low in fat and saturated fat is the same as a diet to decrease blood cholesterol. By eating a diet lower in fat, you may lose weight, lower your blood cholesterol, and lower your blood triglyceride level.  Eating a diet low in fat, especially saturated fat, may also help you lower your blood triglyceride level. Ask your dietitian to help you figure how much fat you can eat based on the number of calories your caregiver has prescribed for you.  Exercise, in addition to helping with weight loss may also help lower triglyceride levels.   Alcohol can increase blood triglycerides. You may need to stop drinking alcoholic beverages.  Too much carbohydrate in your diet may also increase your blood triglycerides. Some complex carbohydrates are necessary in your diet. These may include bread, rice, potatoes, other starchy vegetables and cereals.  Reduce "simple" carbohydrates. These may include pure sugars, candy, honey, and jelly without losing other nutrients. If you have the kind of high blood triglycerides that is affected by the amount of carbohydrates in your diet, you will need to eat less sugar and less high-sugar foods. Your caregiver  can help you with this.  Adding 2-4 grams of fish oil (EPA+ DHA) may also help lower triglycerides. Speak with your caregiver before adding any supplements to your regimen. Following the Diet  Maintain your ideal weight. Your caregivers can help you with a diet. Generally, eating less food and getting more exercise will help you lose  weight. Joining a weight control group may also help. Ask your caregivers for a good weight control group in your area.  Eat low-fat foods instead of high-fat foods. This can help you lose weight too.  These foods are lower in fat. Eat MORE of these:   Dried beans, peas, and lentils.  Egg whites.  Low-fat cottage cheese.  Fish.  Lean cuts of meat, such as round, sirloin, rump, and flank (cut extra fat off meat you fix).  Whole grain breads, cereals and pasta.  Skim and nonfat dry milk.  Low-fat yogurt.  Poultry without the skin.  Cheese made with skim or part-skim milk, such as mozzarella, parmesan, farmers', ricotta, or pot cheese. These are higher fat foods. Eat LESS of these:   Whole milk and foods made from whole milk, such as American, blue, cheddar, monterey jack, and swiss cheese  High-fat meats, such as luncheon meats, sausages, knockwurst, bratwurst, hot dogs, ribs, corned beef, ground pork, and regular ground beef.  Fried foods. Limit saturated fats in your diet. Substituting unsaturated fat for saturated fat may decrease your blood triglyceride level. You will need to read package labels to know which products contain saturated fats.  These foods are high in saturated fat. Eat LESS of these:   Fried pork skins.  Whole milk.  Skin and fat from poultry.  Palm oil.  Butter.  Shortening.  Cream cheese.  Berniece Salines.  Margarines and baked goods made from listed oils.  Vegetable shortenings.  Chitterlings.  Fat from meats.  Coconut oil.  Palm kernel oil.  Lard.  Cream.  Sour cream.  Fatback.  Coffee whiteners and non-dairy creamers made with these oils.  Cheese made from whole milk. Use unsaturated fats (both polyunsaturated and monounsaturated) moderately. Remember, even though unsaturated fats are better than saturated fats; you still want a diet low in total fat.  These foods are high in unsaturated fat:   Canola oil.  Sunflower  oil.  Mayonnaise.  Almonds.  Peanuts.  Pine nuts.  Margarines made with these oils.  Safflower oil.  Olive oil.  Avocados.  Cashews.  Peanut butter.  Sunflower seeds.  Soybean oil.  Peanut oil.  Olives.  Pecans.  Walnuts.  Pumpkin seeds. Avoid sugar and other high-sugar foods. This will decrease carbohydrates without decreasing other nutrients. Sugar in your food goes rapidly to your blood. When there is excess sugar in your blood, your liver may use it to make more triglycerides. Sugar also contains calories without other important nutrients.  Eat LESS of these:   Sugar, brown sugar, powdered sugar, jam, jelly, preserves, honey, syrup, molasses, pies, candy, cakes, cookies, frosting, pastries, colas, soft drinks, punches, fruit drinks, and regular gelatin.  Avoid alcohol. Alcohol, even more than sugar, may increase blood triglycerides. In addition, alcohol is high in calories and low in nutrients. Ask for sparkling water, or a diet soft drink instead of an alcoholic beverage. Suggestions for planning and preparing meals   Bake, broil, grill or roast meats instead of frying.  Remove fat from meats and skin from poultry before cooking.  Add spices, herbs, lemon juice or vinegar to vegetables instead of salt,  rich sauces or gravies.  Use a non-stick skillet without fat or use no-stick sprays.  Cool and refrigerate stews and broth. Then remove the hardened fat floating on the surface before serving.  Refrigerate meat drippings and skim off fat to make low-fat gravies.  Serve more fish.  Use less butter, margarine and other high-fat spreads on bread or vegetables.  Use skim or reconstituted non-fat dry milk for cooking.  Cook with low-fat cheeses.  Substitute low-fat yogurt or cottage cheese for all or part of the sour cream in recipes for sauces, dips or congealed salads.  Use half yogurt/half mayonnaise in salad recipes.  Substitute evaporated skim  milk for cream. Evaporated skim milk or reconstituted non-fat dry milk can be whipped and substituted for whipped cream in certain recipes.  Choose fresh fruits for dessert instead of high-fat foods such as pies or cakes. Fruits are naturally low in fat. When Dining Out   Order low-fat appetizers such as fruit or vegetable juice, pasta with vegetables or tomato sauce.  Select clear, rather than cream soups.  Ask that dressings and gravies be served on the side. Then use less of them.  Order foods that are baked, broiled, poached, steamed, stir-fried, or roasted.  Ask for margarine instead of butter, and use only a small amount.  Drink sparkling water, unsweetened tea or coffee, or diet soft drinks instead of alcohol or other sweet beverages. QUESTIONS AND ANSWERS ABOUT OTHER FATS IN THE BLOOD: SATURATED FAT, TRANS FAT, AND CHOLESTEROL What is trans fat? Trans fat is a type of fat that is formed when vegetable oil is hardened through a process called hydrogenation. This process helps makes foods more solid, gives them shape, and prolongs their shelf life. Trans fats are also called hydrogenated or partially hydrogenated oils.  What do saturated fat, trans fat, and cholesterol in foods have to do with heart disease? Saturated fat, trans fat, and cholesterol in the diet all raise the level of LDL "bad" cholesterol in the blood. The higher the LDL cholesterol, the greater the risk for coronary heart disease (CHD). Saturated fat and trans fat raise LDL similarly.  What foods contain saturated fat, trans fat, and cholesterol? High amounts of saturated fat are found in animal products, such as fatty cuts of meat, chicken skin, and full-fat dairy products like butter, whole milk, cream, and cheese, and in tropical vegetable oils such as palm, palm kernel, and coconut oil. Trans fat is found in some of the same foods as saturated fat, such as vegetable shortening, some margarines (especially hard or  stick margarine), crackers, cookies, baked goods, fried foods, salad dressings, and other processed foods made with partially hydrogenated vegetable oils. Small amounts of trans fat also occur naturally in some animal products, such as milk products, beef, and lamb. Foods high in cholesterol include liver, other organ meats, egg yolks, shrimp, and full-fat dairy products. How can I use the new food label to make heart-healthy food choices? Check the Nutrition Facts panel of the food label. Choose foods lower in saturated fat, trans fat, and cholesterol. For saturated fat and cholesterol, you can also use the Percent Daily Value (%DV): 5% DV or less is low, and 20% DV or more is high. (There is no %DV for trans fat.) Use the Nutrition Facts panel to choose foods low in saturated fat and cholesterol, and if the trans fat is not listed, read the ingredients and limit products that list shortening or hydrogenated or partially hydrogenated vegetable oil,  which tend to be high in trans fat. POINTS TO REMEMBER:   Discuss your risk for heart disease with your caregivers, and take steps to reduce risk factors.  Change your diet. Choose foods that are low in saturated fat, trans fat, and cholesterol.  Add exercise to your daily routine if it is not already being done. Participate in physical activity of moderate intensity, like brisk walking, for at least 30 minutes on most, and preferably all days of the week. No time? Break the 30 minutes into three, 10-minute segments during the day.  Stop smoking. If you do smoke, contact your caregiver to discuss ways in which they can help you quit.  Do not use street drugs.  Maintain a normal weight.  Maintain a healthy blood pressure.  Keep up with your blood work for checking the fats in your blood as directed by your caregiver. Document Released: 06/26/2004 Document Revised: 03/09/2012 Document Reviewed: 01/22/2009 [facility] Patient Information 2014 [place].

## 2013-12-07 NOTE — Progress Notes (Signed)
Subjective:    Patient ID: Derek Kennedy, male    DOB: 11-Mar-1966, 48 y.o.   MRN: [id number]  HPI  Derek Kennedy is here today to go over his most recent lab results.  He has done well since his last office visit.  He would like to discuss the conditions listed below:   1)  Hyperlipidemia - He has done well with Crestor (20 mg - 1/2 of a 40 mg tablet) and needs a refill on it.   2)  ED - He has been taking Cialis but would like to get a refill on sildenafil.  He sent his prescription to [facility] but never received it in the mail.    Review of Systems  Constitutional: Negative for fatigue and unexpected weight change.  HENT: Negative.   Respiratory: Negative for shortness of breath.   Cardiovascular: Negative for chest pain, palpitations and leg swelling.  Gastrointestinal: Negative.   Genitourinary: Negative.   Musculoskeletal: Negative for myalgias.  Skin: Negative.   Neurological: Negative.   Psychiatric/Behavioral: Negative.      Past Medical History  Diagnosis Date  . Vitamin D deficiency   . GERD (gastroesophageal reflux disease)   . 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 Hyperlipidemia      History   Social History Narrative   Marital Status: Married Derek Kennedy)   Children:  [facility]); Stepdaughter Derek Kennedy)    Pets:  None    Living Situation: Lives with spouse and step-children.   Occupation: Biomedical engineer Biomedical engineer)    Education: He attended college at the [employer] [place].  He    is currently enrolled at [employer].  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  He would like to then continue his education at [employer] and get a MBA.     Tobacco Use/Exposure:  He currently does not use any tobacco products.  He has used smokeless tobacco in the past but quit in 2009 with the help of Chantix.    Alcohol Use:  He rarely drinks during the week.  If he drinks it is usually on the weekends.   Drug Use:  None   Diet:  Regular   Exercise:  Yardwork    Hobbies: Golf/ Water Skiing/ [employer] Working               Family History  Problem Relation Age of Onset  . Hypertension Mother   . Heart disease Father   . Hyperlipidemia Father   . Arthritis Maternal Grandmother   . Vision loss Maternal Grandmother   . Heart disease Maternal Grandfather   . Vascular Disease Paternal Grandfather   . Heart disease Paternal Grandfather   . Hyperlipidemia Paternal Grandfather      Current Outpatient Prescriptions on File Prior to Visit  Medication Sig Dispense Refill  . ranitidine (ZANTAC) 150 MG tablet Take 1 tablet (150 mg total) by mouth 2 (two) times daily.  180 tablet  3  . tadalafil (CIALIS) 20 MG tablet Take 1 tab every 3 days as needed for ED  10 tablet  11   No current facility-administered medications on file prior to visit.     No Known Allergies   Immunization History  Administered Date(s) Administered  . Influenza,inj,Quad PF,36+ Mos 06/09/2013  . Tdap 06/08/2008       Objective:   Physical Exam  Constitutional: He is oriented to person, place, and time. He appears well-nourished. No  distress.  HENT:  Head: Normocephalic.  Eyes: No scleral icterus.  Neck: Neck supple. No thyromegaly present.  Cardiovascular: Normal rate, regular rhythm and normal heart sounds.   Pulmonary/Chest: Effort normal and breath sounds normal.  Musculoskeletal: Normal range of motion. He exhibits no edema.  Neurological: He is alert and oriented to person, place, and time.  Skin: Skin is warm and dry. No rash noted.  Psychiatric: He has a normal mood and affect. His behavior is normal. Judgment and thought content normal.      Assessment & Plan:    Mahlon was seen today for medication management. His LDL is better than it needs to be so he is going to lower his Crestor to 10 mg.    Diagnoses and associated orders for this visit:  Erectile dysfunction - sildenafil (REVATIO) 20 MG  tablet; Take 2-5 tabs as needed for ED  Other and unspecified hyperlipidemia - rosuvastatin (CRESTOR) 20 MG tablet; Take 1/2 - 1 tab po daily - COMPLETE METABOLIC PANEL WITH GFR; Future - Lipid panel; Future  GERD (gastroesophageal reflux disease) - RABEprazole (ACIPHEX) 20 MG tablet; Take 1 tablet (20 mg total) by mouth daily.  Unspecified vitamin D deficiency - Vit D  25 hydroxy (rtn osteoporosis monitoring); Future  Fatigue - CBC w/Diff; Future - TSH; Future  Screening PSA (prostate specific antigen) - PSA; Future   TIME SPENT "FACE TO FACE" WITH PATIENT -  30 MINS

## 2014-01-04 ENCOUNTER — Encounter: Payer: Self-pay | Admitting: Family Medicine

## 2015-08-22 ENCOUNTER — Encounter: Payer: Self-pay | Admitting: Internal Medicine

## 2015-10-12 ENCOUNTER — Ambulatory Visit (AMBULATORY_SURGERY_CENTER): Payer: Self-pay | Admitting: *Deleted

## 2015-10-12 VITALS — Ht 74.0 in | Wt 217.0 lb

## 2015-10-12 DIAGNOSIS — Z1211 Encounter for screening for malignant neoplasm of colon: Secondary | ICD-10-CM

## 2015-10-12 NOTE — Progress Notes (Signed)
No allergies to eggs or soy. No prior anesthesia.  Pt given Emmi instructions for colonoscopy  No oxygen use  No diet drug use  

## 2015-10-29 ENCOUNTER — Encounter: Payer: Self-pay | Admitting: Internal Medicine

## 2015-10-29 ENCOUNTER — Ambulatory Visit (AMBULATORY_SURGERY_CENTER): Payer: 59 | Admitting: Internal Medicine

## 2015-10-29 VITALS — BP 115/66 | HR 70 | Temp 97.3°F | Resp 24 | Ht 74.0 in | Wt 215.0 lb

## 2015-10-29 DIAGNOSIS — K621 Rectal polyp: Secondary | ICD-10-CM

## 2015-10-29 DIAGNOSIS — Z1211 Encounter for screening for malignant neoplasm of colon: Secondary | ICD-10-CM

## 2015-10-29 DIAGNOSIS — D124 Benign neoplasm of descending colon: Secondary | ICD-10-CM | POA: Diagnosis not present

## 2015-10-29 DIAGNOSIS — D128 Benign neoplasm of rectum: Secondary | ICD-10-CM | POA: Diagnosis not present

## 2015-10-29 MED ORDER — SODIUM CHLORIDE 0.9 % IV SOLN: 500.0000 mL | INTRAVENOUS | Status: DC

## 2015-10-29 NOTE — Op Note (Signed)
[place]  [address] [place], [id number]   COLONOSCOPY PROCEDURE REPORT  PATIENT: Derek Kennedy, Derek Kennedy  MR#: [id number] BIRTHDATE: 12-25-1965 , 50  yrs. old GENDER: male ENDOSCOPIST: Gatha Mayer, MD, [facility] PROCEDURE DATE:  10/29/2015 PROCEDURE:   Colonoscopy, screening and Colonoscopy with snare polypectomy First Screening Colonoscopy - Avg.  risk and is 50 yrs.  old or older Yes.  Prior Negative Screening - Now for repeat screening. N/A  History of Adenoma - Now for follow-up colonoscopy & has been > or = to 3 yrs.  N/A  Polyps removed today? Yes ASA CLASS:   Class II INDICATIONS:Screening for colonic neoplasia and Colorectal Neoplasm Risk Assessment for this procedure is average risk. MEDICATIONS: Propofol 230 mg IV and Monitored anesthesia care  DESCRIPTION OF PROCEDURE:   After the risks benefits and alternatives of the procedure were thoroughly explained, informed consent was obtained.  The digital rectal exam revealed no abnormalities of the rectum, revealed no prostatic nodules, and revealed the prostate was not enlarged.   The LB [id number] endoscope was introduced through the anus and advanced to the cecum, which was identified by both the appendix and ileocecal valve. No adverse events experienced.   The quality of the prep was excellent.  (MiraLax was used)  The instrument was then slowly withdrawn as the colon was fully examined. Estimated blood loss is zero unless otherwise noted in this procedure report.      COLON FINDINGS: Three sessile polyps ranging from 3 to 68mm in size were found in the descending colon (2) and rectum.  Polypectomies were performed with a cold snare.  The resection was complete, the polyp tissue was completely retrieved and sent to histology.   The examination was otherwise normal.  Retroflexed views revealed no abnormalities. The time to cecum = 2.3 Withdrawal time = 12.0   The scope was withdrawn and the  procedure completed. COMPLICATIONS: There were no immediate complications.  ENDOSCOPIC IMPRESSION: 1.   Three sessile polyps ranging from 3 to 61mm in size were found in the descending colon and rectum; polypectomies were performed with a cold snare 2.   The examination was otherwise normal - excellent prep  RECOMMENDATIONS: Timing of repeat colonoscopy will be determined by pathology findings.  eSigned:  Gatha Mayer, MD, [facility] 10/29/2015 11:31 AM   cc: Dr. Ival Bible and The Patient

## 2015-10-29 NOTE — Patient Instructions (Addendum)
I found and removed 3 tiny polyps - I will let you know pathology results and when to have another routine colonoscopy by mail.  I appreciate the opportunity to care for you. Gatha Mayer, MD, [facility]  COLON POLYP HANDOUT GIVEN.  YOU HAD AN ENDOSCOPIC PROCEDURE TODAY AT [facility]:   Refer to the procedure report that was given to you for any specific questions about what was found during the examination.  If the procedure report does not answer your questions, please call your gastroenterologist to clarify.  If you requested that your care partner not be given the details of your procedure findings, then the procedure report has been included in a sealed envelope for you to review at your convenience later.  YOU SHOULD EXPECT: Some feelings of bloating in the abdomen. Passage of more gas than usual.  Walking can help get rid of the air that was put into your GI tract during the procedure and reduce the bloating. If you had a lower endoscopy (such as a colonoscopy or flexible sigmoidoscopy) you may notice spotting of blood in your stool or on the toilet paper. If you underwent a bowel prep for your procedure, you may not have a normal bowel movement for a few days.  Please Note:  You might notice some irritation and congestion in your nose or some drainage.  This is from the oxygen used during your procedure.  There is no need for concern and it should clear up in a day or so.  SYMPTOMS TO REPORT IMMEDIATELY:   Following lower endoscopy (colonoscopy or flexible sigmoidoscopy):  Excessive amounts of blood in the stool  Significant tenderness or worsening of abdominal pains  Swelling of the abdomen that is new, acute  Fever of 100F or higher  For urgent or emergent issues, a gastroenterologist can be reached at any hour by calling [phone].   DIET: Your first meal following the procedure should be a small meal and then it is ok to progress to your normal diet.  Heavy or fried foods are harder to digest and may make you feel nauseous or bloated.  Likewise, meals heavy in dairy and vegetables can increase bloating.  Drink plenty of fluids but you should avoid alcoholic beverages for 24 hours.  ACTIVITY:  You should plan to take it easy for the rest of today and you should NOT DRIVE or use heavy machinery until tomorrow (because of the sedation medicines used during the test).    FOLLOW UP: Our staff will call the number listed on your records the next business day following your procedure to check on you and address any questions or concerns that you may have regarding the information given to you following your procedure. If we do not reach you, we will leave a message.  However, if you are feeling well and you are not experiencing any problems, there is no need to return our call.  We will assume that you have returned to your regular daily activities without incident.  If any biopsies were taken you will be contacted by phone or by letter within the next 1-3 weeks.  Please call us at [phone] if you have not heard about the biopsies in 3 weeks.    SIGNATURES/CONFIDENTIALITY: You and/or your care partner have signed paperwork which will be entered into your electronic medical record.  These signatures attest to the fact that that the information above on your After Visit Summary has been reviewed  and is understood.  Full responsibility of the confidentiality of this discharge information lies with you and/or your care-partner. 

## 2015-10-29 NOTE — Progress Notes (Signed)
Called to room to assist during endoscopic procedure.  Patient ID and intended procedure confirmed with present staff. Received instructions for my participation in the procedure from the performing physician.  

## 2015-10-29 NOTE — Progress Notes (Signed)
Report to PACU, RN, vss, BBS= Clear.  

## 2015-10-30 ENCOUNTER — Telehealth: Payer: Self-pay

## 2015-10-30 NOTE — Telephone Encounter (Signed)
  Follow up Call-  Call back number 10/29/2015  Post procedure Call Back phone  # [phone]  Permission to leave phone message Yes     Patient was called for follow up after procedure on 10/29/2015. No answer at the number given for follow up phone call. A message was left on the answering machine.

## 2015-11-02 ENCOUNTER — Encounter: Payer: Self-pay | Admitting: Internal Medicine

## 2015-11-02 DIAGNOSIS — Z86010 Personal history of colonic polyps: Secondary | ICD-10-CM

## 2015-11-02 DIAGNOSIS — Z860101 Personal history of adenomatous and serrated colon polyps: Secondary | ICD-10-CM | POA: Insufficient documentation

## 2015-11-02 HISTORY — DX: Personal history of colonic polyps: Z86.010

## 2015-11-02 HISTORY — DX: Personal history of adenomatous and serrated colon polyps: Z86.0101

## 2015-11-02 NOTE — Progress Notes (Signed)
Quick Note:  2 diminutive adenomas Repeat colonoscopy 2022 ______

## 2015-11-05 DIAGNOSIS — R319 Hematuria, unspecified: Secondary | ICD-10-CM | POA: Insufficient documentation

## 2017-09-24 DIAGNOSIS — N400 Enlarged prostate without lower urinary tract symptoms: Secondary | ICD-10-CM | POA: Insufficient documentation

## 2017-09-24 DIAGNOSIS — R972 Elevated prostate specific antigen [PSA]: Secondary | ICD-10-CM | POA: Insufficient documentation

## 2020-08-05 NOTE — Progress Notes (Signed)
Cardiology Office Note:   Date:  08/06/2020  NAME:  Derek Kennedy    MRN: [id number] DOB:  Mar 11, 1966   PCP:  Jonathon Resides, MD  Cardiologist:  No primary care provider on file.   Referring MD: Jonathon Resides, MD   Chief Complaint  Patient presents with  . New Patient (Initial Visit)   History of Present Illness:   Derek Kennedy is a 54 y.o. male with a hx of HLD who is being seen today for the evaluation of family history of CAD at the request of Zanard, Bernadene Bell, MD.  He reports that his father has high LDL pattern B.  He apparently had bypass surgery in his 83s.  He wishes to discuss ways to reduce his risk of having heart attack in the future.  His most recent LDL cholesterol is 91.  He does take Crestor 20 mg daily.  He is a former tobacco user.  He used to use smokeless tobacco.  No longer uses.  His BMI is 27.  No structured exercise but does yard work without any major limitations.  He and his wife are working on their diet.  He denies any chest pain or shortness of breath with his current level of activity.  His EKG today is normal.  He denies any chest pain.  He reports his diet is improved recently.  He is lost 16 pounds as of recently.  Overall just presents for further discussion about ways to reduce his risk of having a heart attack or stroke.  Problem List 1. HLD -T chol 160 HDL 56, TG 65, LDL 91  Past Medical History: Past Medical History:  Diagnosis Date  . Allergy   . GERD (gastroesophageal reflux disease)   . Hx of adenomatous colonic polyps 11/02/2015  . Hyperlipidemia   . 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 Sleep apnea    uses mouthpiece not cpap  . Vitamin D deficiency     Past Surgical History: Past Surgical History:  Procedure Laterality Date  . NO PAST SURGERIES    . WISDOM TOOTH EXTRACTION      Current Medications: Current Meds  Medication Sig  . rosuvastatin (CRESTOR) 20 MG tablet Take 1/2 - 1 tab po daily  (Patient taking differently: Take 20 mg by mouth daily. Take 1/2 - 1 tab po daily)  . tadalafil (CIALIS) 20 MG tablet Take 1 tab every 3 days as needed for ED (Patient taking differently: Take 20 mg by mouth daily as needed. Take 1 tab every 3 days as needed for ED)  . [DISCONTINUED] Bisacodyl (DULCOLAX PO) Take by mouth as directed. Dulcolax as directed for colonoscopy prep  . [DISCONTINUED] Polyethylene Glycol 3350 (MIRALAX PO) Take by mouth as directed. Miralax 238 Grams as directed for colonoscopy prep  . [DISCONTINUED] ranitidine (ZANTAC) 150 MG capsule Take 150 mg by mouth daily. Reported on 10/12/2015     Allergies:    Patient has no known allergies.   Social History: Social History   Socioeconomic History  . Marital status: Married    Spouse name: Not on file  . Number of children: 2  . Years of education: Not on file  . Highest education level: Not on file  Occupational History  . Occupation: Insurance account manager  Tobacco Use  . Smoking status: Never Smoker  . Smokeless tobacco: Former Systems developer    Types: Chew  Substance and Sexual Activity  . Alcohol use: Yes    Alcohol/week:  5.0 standard drinks    Types: 5 Cans of beer per week    Comment: He rarely drinks during the week and usually only drinks on the weekend.    . Drug use: No  . Sexual activity: Yes    Partners: Female    Birth control/protection: Other-see comments  Other Topics Concern  . Not on file  Social History Narrative   Marital Status: Married Sheria Lang)   Children:  Joslyn Hy [facility]); Stepdaughter Herbert Spires)    Pets:  None    Living Situation: Lives with spouse and step-children.   Occupation: Biomedical engineer Biomedical engineer)    Education: He attended college at the [employer] [place].  He    is currently enrolled at [employer].  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  He would like to then continue his education at [employer] and get a MBA.     Tobacco Use/Exposure:  He currently  does not use any tobacco products.  He has used smokeless tobacco in the past but quit in 2009 with the help of Chantix.    Alcohol Use:  He rarely drinks during the week.  If he drinks it is usually on the weekends.   Drug Use:  None   Diet:  Regular   Exercise:  Yardwork    Hobbies: Golf/ Water Skiing/ [employer] Working             Social Determinants of Radio broadcast assistant Strain:   . Difficulty of Paying Living Expenses: Not on file  Food Insecurity:   . Worried About Charity fundraiser in the Last Year: Not on file  . Ran Out of Food in the Last Year: Not on file  Transportation Needs:   . Lack of Transportation (Medical): Not on file  . Lack of Transportation (Non-Medical): Not on file  Physical Activity:   . Days of Exercise per Week: Not on file  . Minutes of Exercise per Session: Not on file  Stress:   . Feeling of Stress : Not on file  Social Connections:   . Frequency of Communication with Friends and Family: Not on file  . Frequency of Social Gatherings with Friends and Family: Not on file  . Attends Religious Services: Not on file  . Active Member of Clubs or Organizations: Not on file  . Attends Archivist Meetings: Not on file  . Marital Status: Not on file    Family History: The patient's family history includes Arthritis in his maternal grandmother; Heart disease in his maternal grandfather and paternal grandfather; Heart disease (age of onset: 54) in his father; Hyperlipidemia in his father and paternal grandfather; Hypertension in his mother; Vascular Disease in his paternal grandfather; Vision loss in his maternal grandmother. There is no history of Colon cancer.  ROS:   All other ROS reviewed and negative. Pertinent positives noted in the HPI.     EKGs/Labs/Other Studies Reviewed:   The following studies were personally reviewed by me today:  EKG:  EKG is ordered today.  The ekg ordered today demonstrates normal sinus rhythm, heart rate 81,  no acute ischemic changes, no evidence of prior infarction, and was personally reviewed by me.   Recent Labs: No results found for requested labs within last 8760 hours.   Recent Lipid Panel    Component Value Date/Time   CHOL 135 11/29/2013 0831   TRIG 152 (H) 11/29/2013 0831   HDL 41 11/29/2013  0831   CHOLHDL 3.3 11/29/2013 0831   VLDL 30 11/29/2013 0831   LDLCALC 64 11/29/2013 0831    Physical Exam:   VS:  BP 108/78 (BP Location: Left Arm, Patient Position: Sitting, Cuff Size: Normal)   Pulse 81   Ht 6\' 1"  (1.854 m)   Wt 204 lb (92.5 kg)   BMI 26.91 kg/m    Wt Readings from Last 3 Encounters:  08/06/20 204 lb (92.5 kg)  10/29/15 215 lb (97.5 kg)  10/12/15 217 lb (98.4 kg)    General: Well nourished, well developed, in no acute distress Heart: Atraumatic, normal size  Eyes: PEERLA, EOMI  Neck: Supple, no JVD Endocrine: No thryomegaly Cardiac: Normal S1, S2; RRR; no murmurs, rubs, or gallops Lungs: Clear to auscultation bilaterally, no wheezing, rhonchi or rales  Abd: Soft, nontender, no hepatomegaly  Ext: No edema, pulses 2+ Musculoskeletal: No deformities, BUE and BLE strength normal and equal Skin: Warm and dry, no rashes   Neuro: Alert and oriented to person, place, time, and situation, CNII-XII grossly intact, no focal deficits  Psych: Normal mood and affect   ASSESSMENT:   Derek Kennedy is a 54 y.o. male who presents for the following: 1. Family history of heart disease   2. Mixed hyperlipidemia     PLAN:   1. Family history of heart disease 2. Mixed hyperlipidemia -He does have a family history of CAD.  His father had CABG in his 30s.  He had an LDL pattern B profile to his LDL cholesterol.  We discussed that coronary calcium scoring is indicated to determine if his LDL cholesterol is too high.  There is no need to further do risk ratification of his LDL cholesterol at this time.  The overall LDL will be the main goal of treatment for him.  His EKG is  normal.  He has no murmurs.  Blood pressure within normal limits.  I encouraged regular exercise as well as proper diet.  We will proceed with coronary calcium scoring and I will get in touch with him by phone.  We will further restratify his need for high intensity statin therapy based on this.  Disposition: Return if symptoms worsen or fail to improve.  Medication Adjustments/Labs and Tests Ordered: Current medicines are reviewed at length with the patient today.  Concerns regarding medicines are outlined above.  Orders Placed This Encounter  Procedures  . CT CARDIAC SCORING  . EKG 12-Lead   No orders of the defined types were placed in this encounter.   Patient Instructions  Medication Instructions:  NO CHANGES  *If you need a refill on your cardiac medications before your next appointment, please call your pharmacy*   Lab Work: NONE If you have labs (blood work) drawn today and your tests are completely normal, you will receive your results only by: Marland Kitchen MyChart Message (if you have MyChart) OR . A paper copy in the mail If you have any lab test that is abnormal or we need to change your treatment, we will call you to review the results.   Testing/Procedures: CALCIUM SCORE OUT OF POCKET $150.00   Follow-Up: At [facility], you and your health needs are our priority.  As part of our continuing mission to provide you with exceptional heart care, we have created designated Provider Care Teams.  These Care Teams include your primary Cardiologist (physician) and Advanced Practice Providers (APPs -  Physician Assistants and Nurse Practitioners) who all work together to provide you with the care  you need, when you need it.  We recommend signing up for the patient portal called "MyChart".  Sign up information is provided on this After Visit Summary.  MyChart is used to connect with patients for Virtual Visits (Telemedicine).  Patients are able to view lab/test results, encounter notes,  upcoming appointments, etc.  Non-urgent messages can be sent to your provider as well.   To learn more about what you can do with MyChart, go to [email or web address].    Your next appointment: AS NEEDED       Signed, Lake Bells T. Audie Box, [place]  [address] [place], [place] [address] [phone]  08/06/2020 3:53 PM

## 2020-08-06 ENCOUNTER — Encounter: Payer: Self-pay | Admitting: Cardiovascular Disease

## 2020-08-06 ENCOUNTER — Other Ambulatory Visit: Payer: Self-pay

## 2020-08-06 ENCOUNTER — Ambulatory Visit: Payer: 59 | Admitting: Cardiovascular Disease

## 2020-08-06 VITALS — BP 108/78 | HR 81 | Ht 73.0 in | Wt 204.0 lb

## 2020-08-06 DIAGNOSIS — E782 Mixed hyperlipidemia: Secondary | ICD-10-CM | POA: Diagnosis not present

## 2020-08-06 DIAGNOSIS — Z8249 Family history of ischemic heart disease and other diseases of the circulatory system: Secondary | ICD-10-CM | POA: Diagnosis not present

## 2020-08-06 NOTE — Patient Instructions (Signed)
Medication Instructions:  NO CHANGES  *If you need a refill on your cardiac medications before your next appointment, please call your pharmacy*   Lab Work: NONE If you have labs (blood work) drawn today and your tests are completely normal, you will receive your results only by:  [place] (if you have MyChart) OR  A paper copy in the mail If you have any lab test that is abnormal or we need to change your treatment, we will call you to review the results.   Testing/Procedures: CALCIUM SCORE OUT OF POCKET $150.00   Follow-Up: At [facility], you and your health needs are our priority.  As part of our continuing mission to provide you with exceptional heart care, we have created designated Provider Care Teams.  These Care Teams include your primary Cardiologist (physician) and Advanced Practice Providers (APPs -  Physician Assistants and Nurse Practitioners) who all work together to provide you with the care you need, when you need it.  We recommend signing up for the patient portal called "MyChart".  Sign up information is provided on this After Visit Summary.  MyChart is used to connect with patients for Virtual Visits (Telemedicine).  Patients are able to view lab/test results, encounter notes, upcoming appointments, etc.  Non-urgent messages can be sent to your provider as well.   To learn more about what you can do with MyChart, go to [email or web address].    Your next appointment: AS NEEDED

## 2020-09-04 ENCOUNTER — Other Ambulatory Visit: Payer: Self-pay

## 2020-09-04 ENCOUNTER — Ambulatory Visit
Admission: RE | Admit: 2020-09-04 | Discharge: 2020-09-04 | Disposition: A | Discharge status: Home / Self Care | Payer: Self-pay | Source: Ambulatory Visit | Attending: Cardiovascular Disease | Admitting: Cardiovascular Disease

## 2020-09-04 DIAGNOSIS — Z8249 Family history of ischemic heart disease and other diseases of the circulatory system: Secondary | ICD-10-CM

## 2021-02-21 ENCOUNTER — Encounter: Payer: Self-pay | Admitting: Internal Medicine

## 2021-04-18 DIAGNOSIS — Z8719 Personal history of other diseases of the digestive system: Secondary | ICD-10-CM | POA: Insufficient documentation

## 2021-10-22 IMAGING — CT CT CARDIAC CORONARY ARTERY CALCIUM SCORE
3 series · 14 of 20 positions shown, 15 images · non-contrast
Comparison: None.
COMPARISON: None.

Addendum:
EXAM:
OVER-READ INTERPRETATION  CT CHEST

The following report is an over-read performed by radiologist Dr.
Berislav Bero Jadanec [REDACTED] on 09/04/2020. This
over-read does not include interpretation of cardiac or coronary
anatomy or pathology. The coronary calcium score interpretation by
the cardiologist is attached.
CLINICAL DATA: Risk stratification
Coronary Calcium Score
TECHNIQUE: The patient was scanned on a Siemens Force scanner. Axial
non-contrast 3 mm slices were carried out through the heart. The
data set was analyzed on a dedicated work station and scored using
the Agatson method.

[Series 2: casc 3.0 bv41 2 bestdiast 68 % · axial · 0.41mm/px · z∈[-252,-162]mm · 4 of 51 slices shown, 5 images]
[im 11/51  vessel]
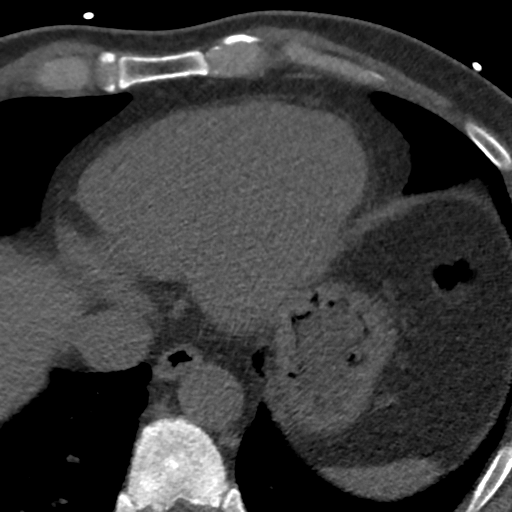
[im 11/51  lung]
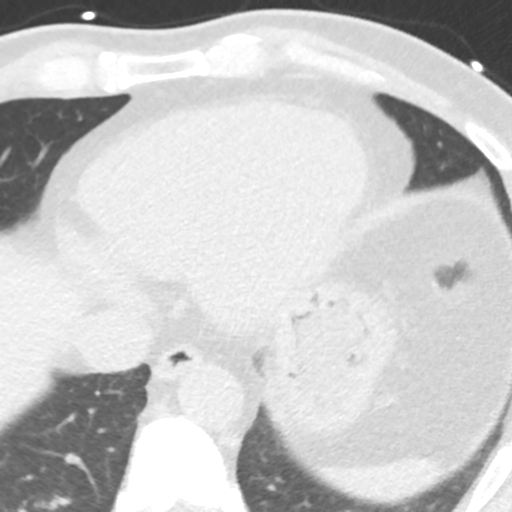
[im 21/51  vessel]
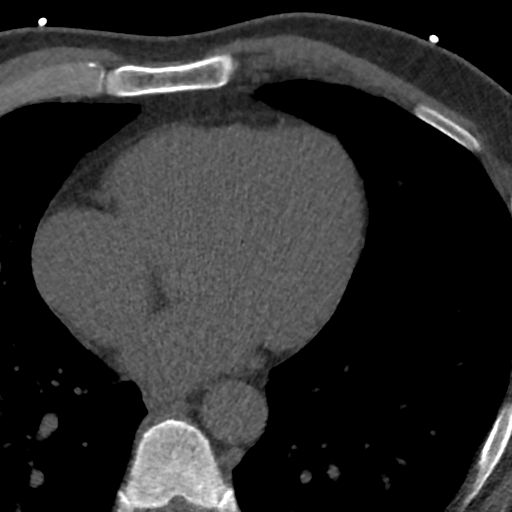
[im 31/51  vessel]
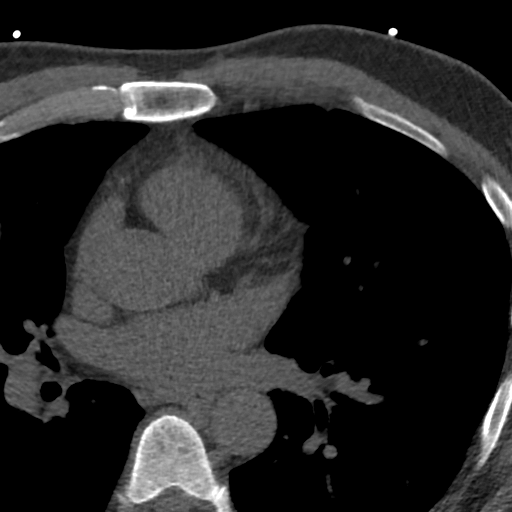
[im 41/51  vessel]
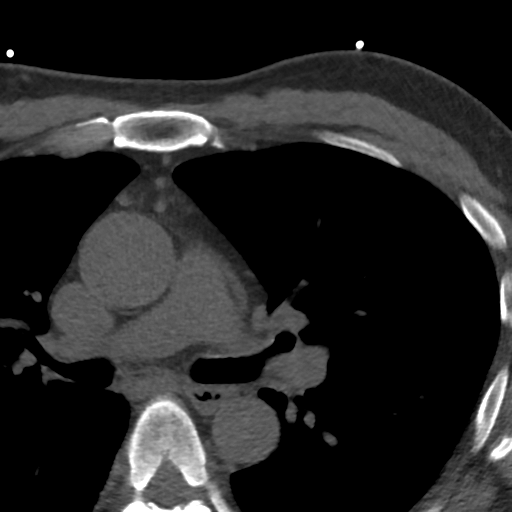

[Series 3: lung 69 % · axial · 0.68mm/px · z∈[-258,-158]mm · 5 of 51 slices shown]
[im 9/51  lung]
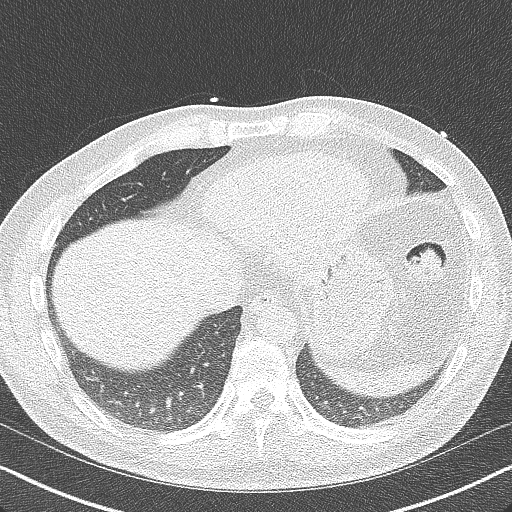
[im 17/51  lung]
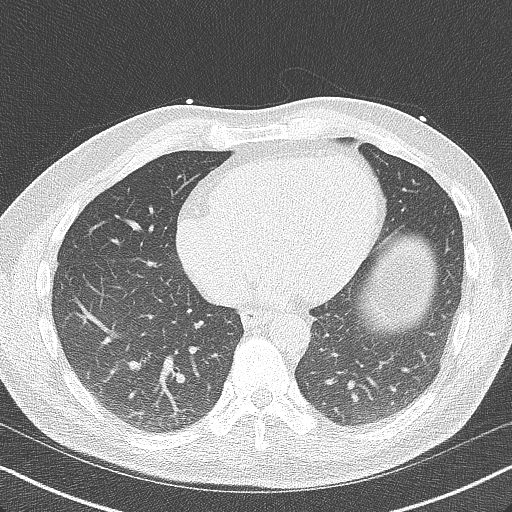
[im 26/51  lung]
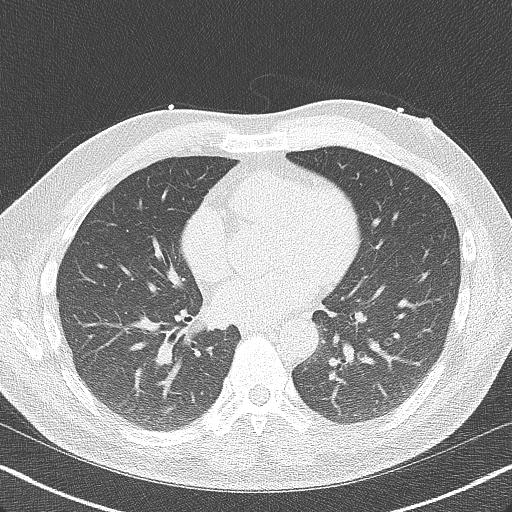
[im 34/51  lung]
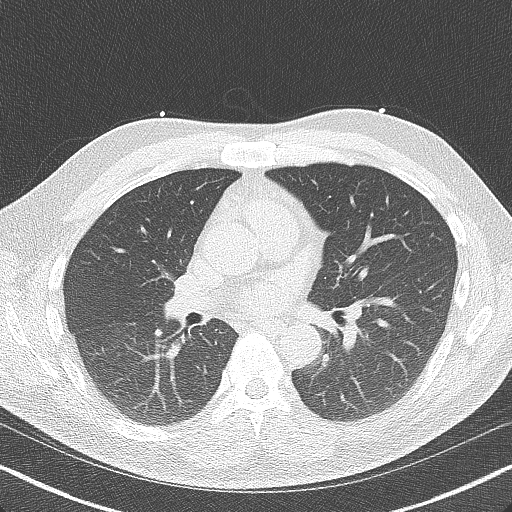
[im 42/51  lung]
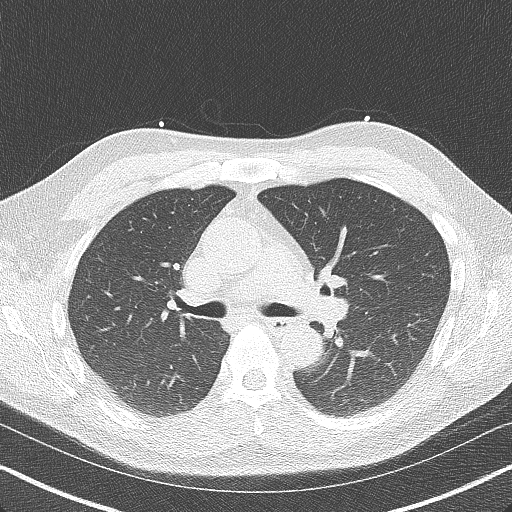

[Series 4: lung st 69 % · axial · 0.68mm/px · z∈[-258,-158]mm · 5 of 51 slices shown]
[im 9/51  lung]
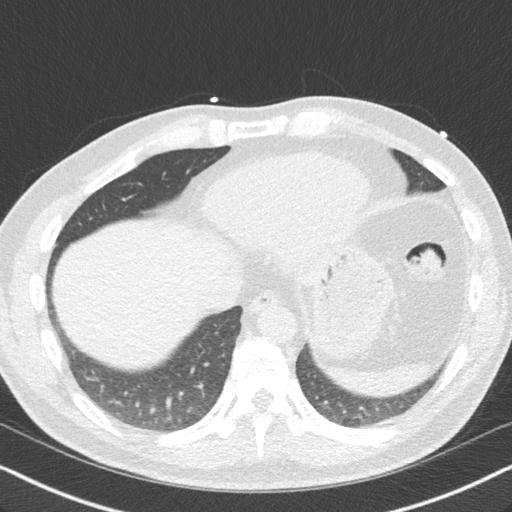
[im 17/51  lung]
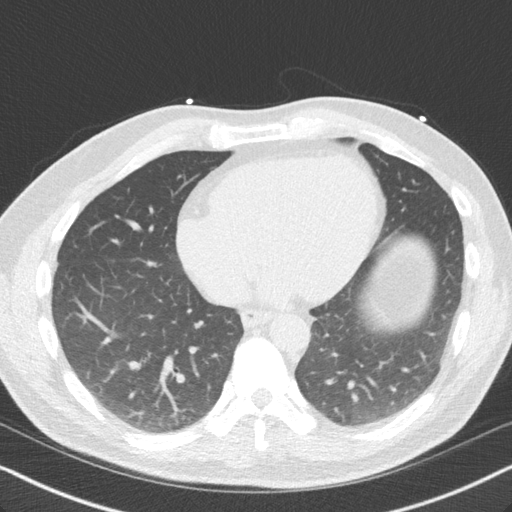
[im 26/51  lung]
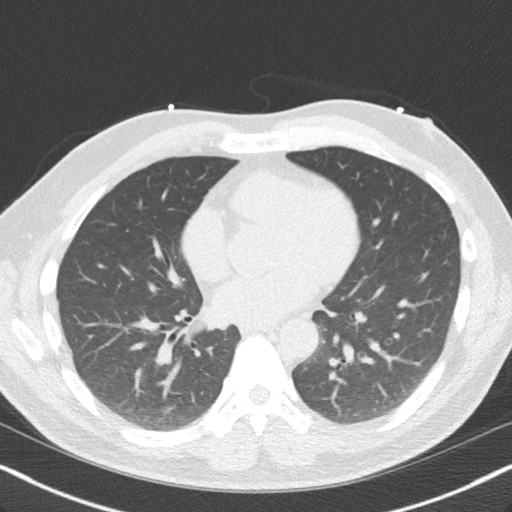
[im 34/51  lung]
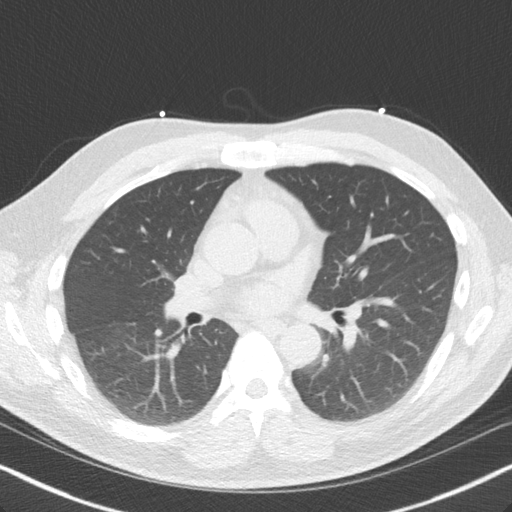
[im 42/51  lung]
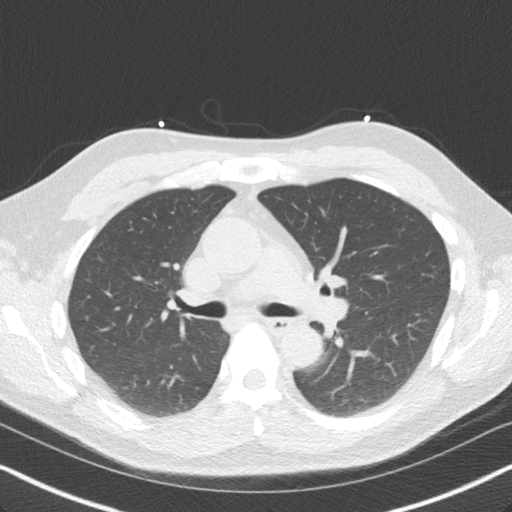

[14 of 20 positions shown; findings below may reference images not displayed]

FINDINGS: Limited view of the lung parenchyma demonstrates no suspicious
nodularity. Airways are normal.

Limited view of the mediastinum demonstrates no adenopathy.
Esophagus normal.

Limited view of the upper abdomen unremarkable.

Limited view of the skeleton and chest wall is unremarkable.
IMPRESSION: No significant extracardiac findings.
FINDINGS: Non-cardiac: See separate report from [REDACTED].

Ascending Aorta: Mildly dilated size, measuring 38 mm at the mid
ascending aorta, measured double oblique at PA bifurcation. No
significant calcification.

Pericardium: Normal

Coronary arteries: Arise from normal coronary cusps.
IMPRESSION: Coronary calcium score of 0. This was 0th percentile for age and sex
matched control.

Welton Rojas

*** End of Addendum ***
EXAM:
OVER-READ INTERPRETATION  CT CHEST

The following report is an over-read performed by radiologist Dr.
Berislav Bero Jadanec [REDACTED] on 09/04/2020. This
over-read does not include interpretation of cardiac or coronary
anatomy or pathology. The coronary calcium score interpretation by
the cardiologist is attached.
FINDINGS: Limited view of the lung parenchyma demonstrates no suspicious
nodularity. Airways are normal.

Limited view of the mediastinum demonstrates no adenopathy.
Esophagus normal.

Limited view of the upper abdomen unremarkable.

Limited view of the skeleton and chest wall is unremarkable.
IMPRESSION: No significant extracardiac findings.

## 2022-05-06 ENCOUNTER — Ambulatory Visit: Payer: 59 | Admitting: Family Medicine

## 2022-05-06 ENCOUNTER — Encounter: Payer: Self-pay | Admitting: Family Medicine

## 2022-05-06 VITALS — BP 122/84 | HR 79 | Temp 98.5°F | Ht 74.0 in | Wt 228.2 lb

## 2022-05-06 DIAGNOSIS — R350 Frequency of micturition: Secondary | ICD-10-CM

## 2022-05-06 DIAGNOSIS — N401 Enlarged prostate with lower urinary tract symptoms: Secondary | ICD-10-CM | POA: Diagnosis not present

## 2022-05-06 DIAGNOSIS — G4733 Obstructive sleep apnea (adult) (pediatric): Secondary | ICD-10-CM

## 2022-05-06 DIAGNOSIS — G4730 Sleep apnea, unspecified: Secondary | ICD-10-CM | POA: Insufficient documentation

## 2022-05-06 DIAGNOSIS — E7800 Pure hypercholesterolemia, unspecified: Secondary | ICD-10-CM | POA: Diagnosis not present

## 2022-05-06 LAB — LIPID PANEL
Cholesterol: 257 mg/dL — ABNORMAL HIGH (ref 0–200)
HDL: 51.9 mg/dL (ref >39.00)
NonHDL: 205.18
Total CHOL/HDL Ratio: 5
Triglycerides: 236 mg/dL — ABNORMAL HIGH (ref 0.0–149.0)
VLDL: 47.2 mg/dL — ABNORMAL HIGH (ref 0.0–40.0)

## 2022-05-06 LAB — COMPREHENSIVE METABOLIC PANEL
ALT: 34 U/L (ref 0–53)
AST: 25 U/L (ref 0–37)
Albumin: 4.6 g/dL (ref 3.5–5.2)
Alkaline Phosphatase: 42 U/L (ref 39–117)
BUN: 20 mg/dL (ref 6–23)
CO2: 24 mEq/L (ref 19–32)
Calcium: 9.5 mg/dL (ref 8.4–10.5)
Chloride: 104 mEq/L (ref 96–112)
Creatinine, Ser: 1.35 mg/dL (ref 0.40–1.50)
GFR: 58.67 mL/min — ABNORMAL LOW (ref >60.00)
Glucose, Bld: 93 mg/dL (ref 70–99)
Potassium: 4.8 mEq/L (ref 3.5–5.1)
Sodium: 140 mEq/L (ref 135–145)
Total Bilirubin: 0.5 mg/dL (ref 0.2–1.2)
Total Protein: 7.6 g/dL (ref 6.0–8.3)

## 2022-05-06 LAB — CBC WITH DIFFERENTIAL/PLATELET
Basophils Absolute: 0 10*3/uL (ref 0.0–0.1)
Basophils Relative: 0.6 % (ref 0.0–3.0)
Eosinophils Absolute: 0.2 10*3/uL (ref 0.0–0.7)
Eosinophils Relative: 3.5 % (ref 0.0–5.0)
HCT: 43.5 % (ref 39.0–52.0)
Hemoglobin: 14.6 g/dL (ref 13.0–17.0)
Lymphocytes Relative: 35.3 % (ref 12.0–46.0)
Lymphs Abs: 1.6 10*3/uL (ref 0.7–4.0)
MCHC: 33.5 g/dL (ref 30.0–36.0)
MCV: 91.5 fl (ref 78.0–100.0)
Monocytes Absolute: 0.5 10*3/uL (ref 0.1–1.0)
Monocytes Relative: 11.3 % (ref 3.0–12.0)
Neutro Abs: 2.3 10*3/uL (ref 1.4–7.7)
Neutrophils Relative %: 49.3 % (ref 43.0–77.0)
Platelets: 270 10*3/uL (ref 150.0–400.0)
RBC: 4.76 Mil/uL (ref 4.22–5.81)
RDW: 13.5 % (ref 11.5–15.5)
WBC: 4.6 10*3/uL (ref 4.0–10.5)

## 2022-05-06 LAB — HEMOGLOBIN A1C: Hgb A1c MFr Bld: 5.9 % (ref 4.6–6.5)

## 2022-05-06 LAB — TSH: TSH: 2.02 u[IU]/mL (ref 0.35–5.50)

## 2022-05-06 LAB — LDL CHOLESTEROL, DIRECT: Direct LDL: 135 mg/dL

## 2022-05-06 MED ORDER — ROSUVASTATIN CALCIUM 10 MG PO TABS: 10.0000 mg | ORAL_TABLET | Freq: Every day | ORAL | 3 refills | Status: DC

## 2022-05-06 NOTE — Progress Notes (Signed)
New Patient Office Visit  Subjective:  Patient ID: Derek Kennedy, male    DOB: 11/24/1965  Age: 56 y.o. MRN: [id number]  CC:  Chief Complaint  Patient presents with   Establish Care    Need new pcp     HPI Willia Kennedy presents for new pt  BPH w/luts-seeing urol.  On tadalifil '5mg'$  daily.  Forgets tamsulosin at HS  OSA-using mouthguard HLD-on crestor '10mg'$  but out for 6 wks-FH CAD.  Not working on diet/exercise.  Dad CAD-60's, pgpa-60's.   Dad has LDL pattern B.  Saw Card in past-CT ok. Tinnitis-bilat  Past Medical History:  Diagnosis Date   Allergy    GERD (gastroesophageal reflux disease)    Hx of adenomatous colonic polyps 11/02/2015   Hyperlipidemia    Other specified hypertrophic and atrophic condition of skin    Sleep apnea    uses mouthpiece not cpap   Vitamin D deficiency     Past Surgical History:  Procedure Laterality Date   HERNIA REPAIR Right 2022   inguinal   TYMPANOSTOMY TUBE PLACEMENT  1971   WISDOM TOOTH EXTRACTION      Family History  Problem Relation Age of Onset   Hypertension Mother    Heart disease Father 65   Hyperlipidemia Father    Heart attack Father    Early death Sister    Cancer Paternal Uncle    Arthritis Maternal Grandmother    Vision loss Maternal Grandmother    Heart attack Maternal Grandfather    Hearing loss Maternal Grandfather    Heart disease Maternal Grandfather    Heart attack Paternal Grandfather    Vascular Disease Paternal Grandfather    Heart disease Paternal Grandfather 50   Hyperlipidemia Paternal Grandfather    Colon cancer Neg Hx     Social History   Socioeconomic History   Marital status: Married    Spouse name: Not on file   Number of children: 2   Years of education: Not on file   Highest education level: Not on file  Occupational History   Occupation: IT Finance  Tobacco Use   Smoking status: Former   Smokeless tobacco: Former    Types: Chew    Quit date: 12/11/2007  Vaping Use   Vaping  Use: Never used  Substance and Sexual Activity   Alcohol use: Yes    Alcohol/week: 5.0 standard drinks of alcohol    Types: 5 Cans of beer per week    Comment: He rarely drinks during the week and usually only drinks on the weekend.     Drug use: No   Sexual activity: Yes    Partners: Female    Birth control/protection: Surgical  Other Topics Concern   Not on file  Social History Narrative   Marital Status: Married Derek Kennedy)Children:  Joslyn Hy Lurena Joiner); Stepdaughter Herbert Spires) Pets:  None Living Situation: Lives with spouse .Occupation: Biomedical engineer Biomedical engineer) Education: He attended college at the [employer] of [place].    He currently does not use any tobacco products.  He has used smokeless tobacco in the past but quit in 2009 with the help of Chantix. Alcohol Use:  He rarely drinks during the week.  If he drinks it is usually on the weekends.Drug Use:  NoneDiet:  RegularExercise:  Yardwork Hobbies: Golf/ Wood Working    Social Determinants of Radio broadcast assistant Strain: Not on [employer] Insecurity: Not on file  Transportation Needs: Not on file  Physical Activity: Not on  file  Stress: Not on file  Social Connections: Not on file  Intimate Partner Violence: Not on file    ROS  ROS: Gen: no fever, chills  Skin: no rash, itching ENT: no ear pain, ear drainage, nasal congestion, rhinorrhea, sinus pressure, sore throat Eyes: no blurry vision, double vision Resp: no cough, wheeze,SOB CV: no CP, palpitations, LE edema,  GI: no heartburn, n/v//c, abd pain.  Loose stools more than regular, but habits unchanged.  Colon when 50, polyp-repeat 42yr. GU: sees urol. MSK: no joint pain, myalgias, back pain Neuro: no dizziness, headache, weakness, vertigo Psych: no depression, anxiety, insomnia, SI   Objective:   Today's Vitals: BP 122/84   Pulse 79   Temp 98.5 F (36.9 C) (Temporal)   Ht '6\' 2"'$  (1.88 m)   Wt 228 lb 4 oz (103.5 kg)   SpO2 95%   BMI 29.31 kg/m    Physical Exam  Gen: WDWN NAD HEENT: NCAT, conjunctiva not injected, sclera nonicteric TM WNL B NECK:  supple, no thyromegaly, no nodes, no carotid bruits CARDIAC: RRR, S1S2+, no murmur. DP 2+B LUNGS: CTAB. No wheezes ABDOMEN:  BS+, soft, NTND, No HSM, no masses EXT:  no edema MSK: no gross abnormalities.  NEURO: A&O x3.  CN II-XII intact.  PSYCH: normal mood. Good eye contact   Assessment & Plan:   Problem List Items Addressed This Visit       Respiratory   OSA (obstructive sleep apnea)     Other   Hyperlipidemia - Primary   Relevant Medications   tadalafil (CIALIS) 5 MG tablet   rosuvastatin (CRESTOR) 10 MG tablet   Other Relevant Orders   Comprehensive metabolic panel   Hemoglobin A1c   TSH   Lipid panel   CBC with Differential/Platelet   Other Visit Diagnoses     Benign prostatic hyperplasia with urinary frequency          HLD-chronic.  Off meds for 6 wks.  Check cbc,cmp,tsh,lipids,A1C.  Restart crestor '10mg'$  probably.  Await results.  FH CAD so suspect more for that as well.  Work on diet/exercise BPH w/LUTS-on tadalafil 5 and tamsulosin 0.'4mg'$  at hs(keeps forgetting-advised set phone alarm) OSA-chronic.  Wearing appliance.  Cont.   F/u 1 yr  Outpatient Encounter Medications as of 05/06/2022  Medication Sig   tadalafil (CIALIS) 5 MG tablet Take 1 tablet by mouth at bedtime.   tamsulosin (FLOMAX) 0.4 MG CAPS capsule Take 1 capsule by mouth daily.   rosuvastatin (CRESTOR) 10 MG tablet Take 1 tablet (10 mg total) by mouth daily.   [DISCONTINUED] rosuvastatin (CRESTOR) 10 MG tablet Take 10 mg by mouth daily. (Patient not taking: Reported on 05/06/2022)   [DISCONTINUED] rosuvastatin (CRESTOR) 20 MG tablet Take 1/2 - 1 tab po daily (Patient taking differently: Take 20 mg by mouth daily. Take 1/2 - 1 tab po daily)   [DISCONTINUED] tadalafil (CIALIS) 20 MG tablet Take 1 tab every 3 days as needed for ED (Patient taking differently: Take 20 mg by mouth daily as needed.  Take 1 tab every 3 days as needed for ED)   No facility-administered encounter medications on file as of 05/06/2022.    Follow-up: Return in about 1 year (around 05/07/2023) for annual.   AWellington Hampshire MD

## 2022-05-06 NOTE — Patient Instructions (Signed)
Welcome to [facility] at [facility]! It was a pleasure meeting you today. ° °As discussed, Please schedule a 12 month follow up visit today. ° °PLEASE NOTE: ° °If you had any LAB tests please let us know if you have not heard back within a few days. You may see your results on MyChart before we have a chance to review them but we will give you a call once they are reviewed by us. If we ordered any REFERRALS today, please let us know if you have not heard from their office within the next week.  °Let us know through MyChart if you are needing REFILLS, or have your pharmacy send us the request. You can also use MyChart to communicate with me or any office staff. ° °Please try these tips to maintain a healthy lifestyle: ° °Eat most of your calories during the day when you are active. Eliminate processed foods including packaged sweets (pies, cakes, cookies), reduce intake of potatoes, white bread, white pasta, and white rice. Look for whole grain options, oat flour or almond flour. ° °Each meal should contain half fruits/vegetables, one quarter protein, and one quarter carbs (no bigger than a computer mouse). ° °Cut down on sweet beverages. This includes juice, soda, and sweet tea. Also watch fruit intake, though this is a healthier sweet option, it still contains natural sugar! Limit to 3 servings daily. ° °Drink at least 1 glass of water with each meal and aim for at least 8 glasses per day ° °Exercise at least 150 minutes every week.   °

## 2022-05-23 ENCOUNTER — Encounter: Payer: Self-pay | Admitting: Family Medicine

## 2022-09-04 ENCOUNTER — Encounter: Payer: Self-pay | Admitting: *Deleted

## 2022-12-03 ENCOUNTER — Encounter: Payer: Self-pay | Admitting: Internal Medicine

## 2023-03-11 ENCOUNTER — Other Ambulatory Visit: Payer: Self-pay | Admitting: Family Medicine

## 2023-03-11 DIAGNOSIS — E7800 Pure hypercholesterolemia, unspecified: Secondary | ICD-10-CM

## 2023-04-22 ENCOUNTER — Encounter (INDEPENDENT_AMBULATORY_CARE_PROVIDER_SITE_OTHER): Payer: Self-pay

## 2023-05-12 ENCOUNTER — Encounter: Payer: Self-pay | Admitting: Family Medicine

## 2023-06-02 ENCOUNTER — Ambulatory Visit (INDEPENDENT_AMBULATORY_CARE_PROVIDER_SITE_OTHER): Payer: 59 | Admitting: Family Medicine

## 2023-06-02 ENCOUNTER — Encounter: Payer: Self-pay | Admitting: Family Medicine

## 2023-06-02 VITALS — BP 109/76 | HR 81 | Temp 98.3°F | Resp 18 | Ht 74.0 in | Wt 229.0 lb

## 2023-06-02 DIAGNOSIS — E7800 Pure hypercholesterolemia, unspecified: Secondary | ICD-10-CM

## 2023-06-02 DIAGNOSIS — Z0000 Encounter for general adult medical examination without abnormal findings: Secondary | ICD-10-CM | POA: Diagnosis not present

## 2023-06-02 DIAGNOSIS — N182 Chronic kidney disease, stage 2 (mild): Secondary | ICD-10-CM

## 2023-06-02 DIAGNOSIS — N401 Enlarged prostate with lower urinary tract symptoms: Secondary | ICD-10-CM

## 2023-06-02 DIAGNOSIS — Z1159 Encounter for screening for other viral diseases: Secondary | ICD-10-CM

## 2023-06-02 DIAGNOSIS — Z125 Encounter for screening for malignant neoplasm of prostate: Secondary | ICD-10-CM | POA: Diagnosis not present

## 2023-06-02 DIAGNOSIS — N138 Other obstructive and reflux uropathy: Secondary | ICD-10-CM | POA: Insufficient documentation

## 2023-06-02 DIAGNOSIS — R3129 Other microscopic hematuria: Secondary | ICD-10-CM

## 2023-06-02 MED ORDER — IPRATROPIUM BROMIDE 0.06 % NA SOLN: 2.0000 | Freq: Four times a day (QID) | NASAL | 12 refills | Status: DC

## 2023-06-02 NOTE — Patient Instructions (Signed)
It was very nice to see you today!  exercise   PLEASE NOTE:  If you had any lab tests please let us know if you have not heard back within a few days. You may see your results on MyChart before we have a chance to review them but we will give you a call once they are reviewed by [place]. If we ordered any referrals today, please let us know if you have not heard from their office within the next week.   Please try these tips to maintain a healthy lifestyle:  Eat most of your calories during the day when you are active. Eliminate processed foods including packaged sweets (pies, cakes, cookies), reduce intake of potatoes, white bread, white pasta, and white rice. Look for whole grain options, oat flour or almond flour.  Each meal should contain half fruits/vegetables, one quarter protein, and one quarter carbs (no bigger than a computer mouse).  Cut down on sweet beverages. This includes juice, soda, and sweet tea. Also watch fruit intake, though this is a healthier sweet option, it still contains natural sugar! Limit to 3 servings daily.  Drink at least 1 glass of water with each meal and aim for at least 8 glasses per day  Exercise at least 150 minutes every week.

## 2023-06-02 NOTE — Assessment & Plan Note (Signed)
Chronic.  Controlled with tadalafil and tamsulosin.  Managed by urology

## 2023-06-02 NOTE — Progress Notes (Signed)
Phone: [phone]   Subjective:  Patient 57 y.o. male presenting for annual physical.  Chief Complaint  Patient presents with   Annual Exam    CPE Not fasting  HPI  Annual - Discussed overdue colonoscopy. He reports that he will schedule-he did get reminder card.   BPH w/LUTS - Followed up with Marlana Salvage, MD with urology. Underwent flexible cystoscopy and CTA and renal u/s; findings were consistent with chronic medical renal disease, otherwise unremarkable. Told to f/u PCP  Post Nasal Drainage - Reports that he has been experiencing post nasal drainage, congestion, productive and dry coughs. States that drainage and productive cough tend to occur in the morning and dry cough in the afternoon. Notes symptoms occur inconsistently with meals. Also endorses mild heartburn, and diarrhea (reportedly normal for him).   See problem oriented charting- ROS- ROS: Gen: no fever, chills  Skin: no rash, itching ENT: no ear pain, ear drainage, rhinorrhea, sinus pressure, sore throat Eyes: no blurry vision, double vision Resp:no wheeze,SOB CV: no CP, palpitations, LE edema,  GI: n/v/d/c, abd pain GU: no dysuria, urgency, frequency MSK: no joint pain, myalgias, back pain Neuro: no dizziness, headache, weakness, vertigo Psych: no depression, anxiety, insomnia, SI   The following were reviewed and entered/updated in epic: Past Medical History:  Diagnosis Date   Allergy    BPH with obstruction/lower urinary tract symptoms    GERD (gastroesophageal reflux disease)    Hx of adenomatous colonic polyps 11/02/2015   Hyperlipidemia    Other specified hypertrophic and atrophic condition of skin    Sleep apnea    uses mouthpiece not cpap   Vitamin D deficiency    Patient Active Problem List   Diagnosis Date Noted   BPH with obstruction/lower urinary tract symptoms    OSA (obstructive sleep apnea) 05/06/2022   Hx of adenomatous colonic polyps 11/02/2015   Need for prophylactic  vaccination and inoculation against influenza 08/21/2013   GERD (gastroesophageal reflux disease) 08/21/2013   Erectile dysfunction 08/21/2013   Cough 08/21/2013   Vitamin D deficiency    Other specified hypertrophic and atrophic condition of skin    Hyperlipidemia    Past Surgical History:  Procedure Laterality Date   HERNIA REPAIR Right 2022   inguinal   TYMPANOSTOMY TUBE PLACEMENT  1971   WISDOM TOOTH EXTRACTION      Family History  Problem Relation Age of Onset   Hypertension Mother    Heart disease Father 52   Hyperlipidemia Father    Heart attack Father    Kidney disease Father    Early death Sister    Cancer Paternal Uncle    Arthritis Maternal Grandmother    Vision loss Maternal Grandmother    Heart attack Maternal Grandfather    Hearing loss Maternal Grandfather    Heart disease Maternal Grandfather    Heart attack Paternal Grandfather    Vascular Disease Paternal Grandfather    Heart disease Paternal Grandfather 24   Hyperlipidemia Paternal Grandfather    Colon cancer Neg Hx     Medications- reviewed and updated Current Outpatient Medications  Medication Sig Dispense Refill   ipratropium (ATROVENT) 0.06 % nasal spray Place 2 sprays into both nostrils 4 (four) times daily. 15 mL 12   rosuvastatin (CRESTOR) 10 MG tablet Take 1 tablet (10 mg total) by mouth daily. 90 tablet 3   tadalafil (CIALIS) 5 MG tablet Take 1 tablet by mouth at bedtime.     tamsulosin (FLOMAX) 0.4 MG CAPS capsule Take 0.4  mg by mouth daily.     No current facility-administered medications for this visit.    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    He currently does not use any tobacco products.   He has used smokeless tobacco in the past but quit in 2009 with the help of Chantix. Alcohol Use:  He rarely drinks during the week.  If he drinks it is usually on the weekends.Drug Use:  NoneDiet:  RegularExercise:  Yardwork Hobbies: Golf/ Wood Working    Objective  Objective:  BP 109/76   Pulse 81   Temp 98.3 F (36.8 C) (Temporal)   Resp 18   Ht 6\' 2"  (1.88 m)   Wt 229 lb (103.9 kg)   SpO2 96%   BMI 29.40 kg/m  Physical Exam  Gen: WDWN NAD HEENT: NCAT, conjunctiva not injected, sclera nonicteric TM WNL B, OP moist, no exudates  NECK:  supple, no thyromegaly, no nodes, no carotid bruits CARDIAC: RRR, S1S2+, no murmur. DP 2+B LUNGS: CTAB. No wheezes ABDOMEN:  BS+, soft, NTND, No HSM, no masses EXT:  no edema MSK: no gross abnormalities. MS 5/5 all 4 NEURO: A&O x3.  CN II-XII intact.  PSYCH: normal mood. Good eye contact     Assessment and Plan   Health Maintenance counseling: 1. Anticipatory guidance: Patient counseled regarding regular dental exams q6 months, eye exams yearly, avoiding smoking and second hand smoke, limiting alcohol to 2 beverages per day.   2. Risk factor reduction:  Advised patient of need for regular exercise and diet rich in fruits and vegetables to reduce risk of heart attack and stroke. Exercise- not regularly, does yard/housework.   Wt Readings from Last 3 Encounters:  06/02/23 229 lb (103.9 kg)  05/06/22 228 lb 4 oz (103.5 kg)  08/06/20 204 lb (92.5 kg)   3. Immunizations/screenings/ancillary studies Immunization History  Administered Date(s) Administered   Influenza,inj,Quad PF,6+ Mos 06/09/2013, 07/24/2015, 08/20/2016, 08/26/2017, 07/11/2019, 07/12/2020   Influenza-Unspecified 06/09/2013, 07/24/2015, 08/20/2016   PFIZER Comirnaty(Gray Top)Covid-19 Tri-Sucrose Vaccine 04/02/2021   PFIZER(Purple Top)SARS-COV-2 Vaccination 12/01/2019, 12/22/2019, 08/06/2020   Pfizer Covid-19 Vaccine Bivalent Booster 64yrs & up 07/31/2021, 05/04/2022   Tdap  06/08/2008, 03/12/2021   Health Maintenance Due  Topic Date Due   HIV Screening  Never done   Hepatitis C Screening  Never done    4. Prostate cancer screening >55yo - risk factors? No results found for: "PSA"  5. Colon cancer screening: Last done 10/29/15(Overdue since 10/28/20). 3 polyps removed, otherwise normal.  6. Skin cancer screening- advised regular sunscreen use. Denies worrisome, changing, or new skin lesions.  7. Smoking associated screening (lung cancer screening, AAA screen 65-75, UA)- former smokeless tobacco use. 8. STD screening - N/A  Wellness examination -     CBC with Differential/Platelet -     Comprehensive metabolic panel -     Lipid panel -     TSH -     Hemoglobin A1c -     PSA  Pure hypercholesterolemia Assessment & Plan: Chronic.  Controlled.  Continue Crestor 10 mg daily  Orders: -     Lipid panel  BPH with obstruction/lower urinary tract symptoms Assessment & Plan: Chronic.  Controlled with tadalafil and tamsulosin.  Managed by urology   Screening  for prostate cancer -     PSA  Microhematuria -     Ambulatory referral to Nephrology  Stage 2 chronic kidney disease -     Ambulatory referral to Nephrology  Screening for viral disease -     Hepatitis C antibody -     HIV Antibody (routine testing w rflx)  Other orders -     Ipratropium Bromide; Place 2 sprays into both nostrils 4 (four) times daily.  Dispense: 15 mL; Refill: 12   Wellness-anticipatory guidance.  Work on Diet/Exercise  Check CBC,CMP,lipids,TSH, A1C.  F/u 1 yr  Cough/congestion-differential diagnosis includes GERD, postnasal drip, allergies, other.  Will trial Atrovent nasal spray 4 times daily (morning, and before meals).  If not helping, will do omeprazole 40 mg twice daily for up to 8 weeks.  Consider chest x-ray.  Consider referral to ENT  Recommended follow up: Return in about 1 year (around 06/01/2024) for annual physical.  Lab/Order associations: not fasting   I,Emily  Lagle,acting as a scribe for Angelena Sole, MD.,have documented all relevant documentation on the behalf of Angelena Sole, MD,as directed by  Angelena Sole, MD while in the presence of Angelena Sole, MD.   I, Angelena Sole, MD, have reviewed all documentation for this visit. The documentation on 06/02/23 for the exam, diagnosis, procedures, and orders are all accurate and complete.    Angelena Sole, MD

## 2023-06-02 NOTE — Assessment & Plan Note (Signed)
Chronic.  Controlled.  Continue Crestor 10 mg daily

## 2023-06-03 LAB — CBC WITH DIFFERENTIAL/PLATELET
Basophils Absolute: 0 10*3/uL (ref 0.0–0.1)
Basophils Relative: 0.7 % (ref 0.0–3.0)
Eosinophils Absolute: 0.2 10*3/uL (ref 0.0–0.7)
Eosinophils Relative: 2.7 % (ref 0.0–5.0)
HCT: 43 % (ref 39.0–52.0)
Hemoglobin: 14.1 g/dL (ref 13.0–17.0)
Lymphocytes Relative: 35.5 % (ref 12.0–46.0)
Lymphs Abs: 2.5 10*3/uL (ref 0.7–4.0)
MCHC: 32.7 g/dL (ref 30.0–36.0)
MCV: 92.6 fl (ref 78.0–100.0)
Monocytes Absolute: 0.6 10*3/uL (ref 0.1–1.0)
Monocytes Relative: 8.8 % (ref 3.0–12.0)
Neutro Abs: 3.7 10*3/uL (ref 1.4–7.7)
Neutrophils Relative %: 52.3 % (ref 43.0–77.0)
Platelets: 319 10*3/uL (ref 150.0–400.0)
RBC: 4.65 Mil/uL (ref 4.22–5.81)
RDW: 13.9 % (ref 11.5–15.5)
WBC: 7.1 10*3/uL (ref 4.0–10.5)

## 2023-06-03 LAB — TSH: TSH: 1.77 u[IU]/mL (ref 0.35–5.50)

## 2023-06-03 LAB — LIPID PANEL
Cholesterol: 176 mg/dL (ref 0–200)
HDL: 56.7 mg/dL (ref >39.00)
LDL Cholesterol: 76 mg/dL (ref 0–99)
NonHDL: 118.9
Total CHOL/HDL Ratio: 3
Triglycerides: 216 mg/dL — ABNORMAL HIGH (ref 0.0–149.0)
VLDL: 43.2 mg/dL — ABNORMAL HIGH (ref 0.0–40.0)

## 2023-06-03 LAB — PSA: PSA: 2.4 ng/mL (ref 0.10–4.00)

## 2023-06-03 LAB — COMPREHENSIVE METABOLIC PANEL
ALT: 23 U/L (ref 0–53)
AST: 15 U/L (ref 0–37)
Albumin: 4.3 g/dL (ref 3.5–5.2)
Alkaline Phosphatase: 39 U/L (ref 39–117)
BUN: 24 mg/dL — ABNORMAL HIGH (ref 6–23)
CO2: 27 meq/L (ref 19–32)
Calcium: 9.2 mg/dL (ref 8.4–10.5)
Chloride: 105 meq/L (ref 96–112)
Creatinine, Ser: 1.44 mg/dL (ref 0.40–1.50)
GFR: 53.89 mL/min — ABNORMAL LOW (ref >60.00)
Glucose, Bld: 89 mg/dL (ref 70–99)
Potassium: 4.3 meq/L (ref 3.5–5.1)
Sodium: 140 meq/L (ref 135–145)
Total Bilirubin: 0.4 mg/dL (ref 0.2–1.2)
Total Protein: 7.3 g/dL (ref 6.0–8.3)

## 2023-06-03 LAB — HEPATITIS C ANTIBODY: Hepatitis C Ab: NONREACTIVE

## 2023-06-03 LAB — HIV ANTIBODY (ROUTINE TESTING W REFLEX): HIV 1&2 Ab, 4th Generation: NONREACTIVE

## 2023-06-03 LAB — HEMOGLOBIN A1C
Hgb A1c MFr Bld: 5.7 %{Hb} — ABNORMAL HIGH (ref <5.7)
Mean Plasma Glucose: 117 mg/dL
eAG (mmol/L): 6.5 mmol/L

## 2023-06-04 NOTE — Progress Notes (Signed)
1.  A1C(3 month average of sugars) is elevated.  This is considered PreDiabetes.  Work on diet-decrease sugars and starches and aim for 30 minutes of exercise 5 days/week to prevent progression to diabetes  2.  Cholesterol-triglycerides elevated some-of course, work on diet/exercise 3.  Kidney function-about/same to a little worse.  Referral was placed to nephrology.  Limit NSAID's.  Drink plenty of water

## 2024-03-24 ENCOUNTER — Other Ambulatory Visit: Payer: Self-pay | Admitting: Family Medicine

## 2024-03-24 DIAGNOSIS — E7800 Pure hypercholesterolemia, unspecified: Secondary | ICD-10-CM

## 2024-04-18 ENCOUNTER — Ambulatory Visit: Admission: EM | Admit: 2024-04-18 | Discharge: 2024-04-18 | Disposition: A | Discharge status: Home / Self Care

## 2024-04-18 DIAGNOSIS — E7800 Pure hypercholesterolemia, unspecified: Secondary | ICD-10-CM | POA: Insufficient documentation

## 2024-04-18 DIAGNOSIS — M545 Low back pain, unspecified: Secondary | ICD-10-CM | POA: Diagnosis not present

## 2024-04-18 MED ORDER — PREDNISONE 20 MG PO TABS: 40.0000 mg | ORAL_TABLET | Freq: Every day | ORAL | 0 refills | Status: AC

## 2024-04-18 MED ORDER — CYCLOBENZAPRINE HCL 10 MG PO TABS: 10.0000 mg | ORAL_TABLET | Freq: Two times a day (BID) | ORAL | 0 refills | Status: AC | PRN

## 2024-04-18 NOTE — ED Triage Notes (Signed)
 I am having back pain, I have episodes of this in the past, it has been spasm's in the past. Lower back, right/left/center, some radiation of pain up back if moved incorrectly, never down buttocks/leg.

## 2024-04-20 NOTE — ED Provider Notes (Signed)
 [facility]    CSN: [id number] Arrival date & time: 04/18/24  1105      History   Chief Complaint Chief Complaint  Patient presents with   Back Pain    HPI Derek Kennedy is a 58 y.o. male.   Patient presents today for evaluation of low back pain.  He reports pain across his low back.  Occasionally he will have some radiation of pain if he moves incorrectly but never down his leg or buttocks.  He denies any numbness or tingling.  He has not had any injury.  He has had similar episodes in the past.   Back Pain Associated symptoms: no fever and no numbness     Past Medical History:  Diagnosis Date   Allergy    BPH with obstruction/lower urinary tract symptoms    Cough 08/21/2013   GERD (gastroesophageal reflux disease)    GERD (gastroesophageal reflux disease) 08/21/2013   Hx of adenomatous colonic polyps 11/02/2015   Hyperlipidemia    Need for prophylactic vaccination and inoculation against influenza 08/21/2013   Other specified hypertrophic and atrophic condition of skin    Other specified hypertrophic and atrophic condition of skin    Sleep apnea    uses mouthpiece not cpap   Vitamin D deficiency    Vitamin D deficiency     Patient Active Problem List   Diagnosis Date Noted   Hypercholesterolemia 04/18/2024   Sleep apnea 05/06/2022   S/P inguinal hernia repair 04/18/2021   BPH (benign prostatic hyperplasia) 09/24/2017   Elevated PSA 09/24/2017   Hematuria 11/05/2015   Hx of adenomatous colonic polyps 11/02/2015   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 Authorizing Provider  cyclobenzaprine  (FLEXERIL ) 10 MG tablet Take 1 tablet (10 mg total) by mouth 2 (two) times  daily as needed for muscle spasms. 04/18/24  Yes Billy Asberry FALCON, PA-C  predniSONE  (DELTASONE ) 20 MG tablet Take 2 tablets (40 mg total) by mouth daily with breakfast for 5 days. 04/18/24 04/23/24 Yes Billy Asberry FALCON, PA-C  rosuvastatin  (CRESTOR ) 10 MG tablet Take 1 tablet (10 mg total) by mouth daily. 03/24/24  Yes Wendolyn Jenkins Jansky, MD  tadalafil  (CIALIS ) 5 MG tablet Take 5 mg by mouth daily. 03/16/24  Yes [provider]  tamsulosin (FLOMAX) 0.4 MG CAPS capsule Take 0.4 mg by mouth daily.   Yes [provider]  ipratropium (ATROVENT ) 0.06 % nasal spray Place 2 sprays into both nostrils 4 (four) times daily. 06/02/23   Wendolyn Jenkins Jansky, MD  tadalafil  (CIALIS ) 5 MG tablet Take 1 tablet by mouth at bedtime. 03/27/22 06/02/23  [provider]    Family History Family History  Problem Relation Age of Onset   Hypertension Mother    Heart disease Father 106   Hyperlipidemia Father    Heart attack Father    Kidney disease Father    Early death Sister    Arthritis Maternal Grandmother    Vision loss Maternal Grandmother    Heart attack Maternal Grandfather    Hearing loss Maternal Grandfather    Heart disease Maternal Grandfather    Heart attack Paternal Grandfather    Vascular Disease  Paternal Grandfather    Heart disease Paternal Grandfather 23   Hyperlipidemia Paternal Grandfather    Cancer Paternal Uncle    Colon cancer Neg Hx     Social History Social History   Tobacco Use   Smoking status: Former    Current packs/day: 0.00    Types: Cigarettes    Quit date: 04/22/2009    Years since quitting: 15.0   Smokeless tobacco: Former    Types: Chew    Quit date: 12/11/2007  Vaping Use   Vaping status: Never Used  Substance Use Topics   Alcohol use: Yes    Alcohol/week: 5.0 standard drinks of alcohol    Types: 5 Cans of beer per week    Comment: He rarely drinks during the week and usually only drinks on the weekend.     Drug use: No     Allergies   Patient  has no known allergies.   Review of Systems Review of Systems  Constitutional:  Negative for chills and fever.  Eyes:  Negative for discharge and redness.  Respiratory:  Negative for shortness of breath.   Musculoskeletal:  Positive for back pain and myalgias.  Neurological:  Negative for numbness.     Physical Exam Triage Vital Signs ED Triage Vitals  Encounter Vitals Group     BP 04/18/24 1139 120/76     Girls Systolic BP Percentile --      Girls Diastolic BP Percentile --      Boys Systolic BP Percentile --      Boys Diastolic BP Percentile --      Pulse Rate 04/18/24 1139 80     Resp 04/18/24 1139 18     Temp 04/18/24 1139 98 F (36.7 C)     Temp Source 04/18/24 1139 Oral     SpO2 04/18/24 1139 98 %     Weight 04/18/24 1136 224 lb (101.6 kg)     Height 04/18/24 1136 6' 2 (1.88 m)     Head Circumference --      Peak Flow --      Pain Score 04/18/24 1132 9     Pain Loc --      Pain Education --      Exclude from Growth Chart --    No data found.  Updated Vital Signs BP 120/76 (BP Location: Left Arm)   Pulse 80   Temp 98 F (36.7 C) (Oral)   Resp 18   Ht 6' 2 (1.88 m)   Wt 224 lb (101.6 kg)   SpO2 98%   BMI 28.76 kg/m   Visual Acuity Right Eye Distance:   Left Eye Distance:   Bilateral Distance:    Right Eye Near:   Left Eye Near:    Bilateral Near:     Physical Exam Vitals and nursing note reviewed.  Constitutional:      General: He is not in acute distress.    Appearance: Normal appearance. He is not ill-appearing.  HENT:     Head: Normocephalic and atraumatic.  Eyes:     Conjunctiva/sclera: Conjunctivae normal.  Cardiovascular:     Rate and Rhythm: Normal rate.  Pulmonary:     Effort: Pulmonary effort is normal. No respiratory distress.  Musculoskeletal:     Comments: No tenderness to palpation to midline thoracic or lumbar spine, mild tenderness across low back.  Neurological:     Mental Status: He is alert.  Psychiatric:         Mood and  Affect: Mood normal.        Behavior: Behavior normal.        Thought Content: Thought content normal.      UC Treatments / Results  Labs (all labs ordered are listed, but only abnormal results are displayed) Labs Reviewed - No data to display  EKG   Radiology No results found.  Procedures Procedures (including critical care time)  Medications Ordered in UC Medications - No data to display  Initial Impression / Assessment and Plan / UC Course  I have reviewed the triage vital signs and the nursing notes.  Pertinent labs & imaging results that were available during my care of the patient were reviewed by me and considered in my medical decision making (see chart for details).    Will treat to cover muscle strain with steroid burst and muscle relaxer.  Advised muscle relaxer may cause drowsiness and use with caution.  Recommended heat and massage to also help with symptom relief.  Encouraged follow-up if no gradual improvement or with any further concerns.  Final Clinical Impressions(s) / UC Diagnoses   Final diagnoses:  Acute bilateral low back pain without sciatica   Discharge Instructions   None    ED Prescriptions     Medication Sig Dispense Auth. Provider   predniSONE  (DELTASONE ) 20 MG tablet Take 2 tablets (40 mg total) by mouth daily with breakfast for 5 days. 10 tablet Billy Stabs F, PA-C   cyclobenzaprine  (FLEXERIL ) 10 MG tablet Take 1 tablet (10 mg total) by mouth 2 (two) times daily as needed for muscle spasms. 20 tablet Billy Stabs FALCON, PA-C      PDMP not reviewed this encounter.   Billy Stabs FALCON, PA-C 04/20/24 [phone]

## 2024-06-07 ENCOUNTER — Encounter: Payer: 59 | Admitting: Family Medicine

## 2024-07-11 ENCOUNTER — Other Ambulatory Visit: Payer: Self-pay | Admitting: Family Medicine

## 2024-10-04 ENCOUNTER — Ambulatory Visit: Payer: Self-pay | Admitting: Family Medicine

## 2024-10-04 ENCOUNTER — Ambulatory Visit: Admitting: Family Medicine

## 2024-10-04 ENCOUNTER — Encounter: Payer: Self-pay | Admitting: Family Medicine

## 2024-10-04 VITALS — BP 128/82 | HR 86 | Temp 97.3°F | Ht 74.0 in | Wt 235.2 lb

## 2024-10-04 DIAGNOSIS — N401 Enlarged prostate with lower urinary tract symptoms: Secondary | ICD-10-CM | POA: Diagnosis not present

## 2024-10-04 DIAGNOSIS — E559 Vitamin D deficiency, unspecified: Secondary | ICD-10-CM | POA: Diagnosis not present

## 2024-10-04 DIAGNOSIS — Z6830 Body mass index (BMI) 30.0-30.9, adult: Secondary | ICD-10-CM | POA: Diagnosis not present

## 2024-10-04 DIAGNOSIS — E66811 Obesity, class 1: Secondary | ICD-10-CM

## 2024-10-04 DIAGNOSIS — Z23 Encounter for immunization: Secondary | ICD-10-CM

## 2024-10-04 DIAGNOSIS — E7800 Pure hypercholesterolemia, unspecified: Secondary | ICD-10-CM | POA: Diagnosis not present

## 2024-10-04 DIAGNOSIS — E6609 Other obesity due to excess calories: Secondary | ICD-10-CM | POA: Diagnosis not present

## 2024-10-04 DIAGNOSIS — R350 Frequency of micturition: Secondary | ICD-10-CM | POA: Diagnosis not present

## 2024-10-04 DIAGNOSIS — Z0000 Encounter for general adult medical examination without abnormal findings: Secondary | ICD-10-CM

## 2024-10-04 LAB — COMPREHENSIVE METABOLIC PANEL WITH GFR
ALT: 27 U/L (ref 3–53)
AST: 17 U/L (ref 5–37)
Albumin: 4.7 g/dL (ref 3.5–5.2)
Alkaline Phosphatase: 36 U/L — ABNORMAL LOW (ref 39–117)
BUN: 25 mg/dL — ABNORMAL HIGH (ref 6–23)
CO2: 24 meq/L (ref 19–32)
Calcium: 9.3 mg/dL (ref 8.4–10.5)
Chloride: 102 meq/L (ref 96–112)
Creatinine, Ser: 1.28 mg/dL (ref 0.40–1.50)
GFR: 61.48 mL/min
Glucose, Bld: 101 mg/dL — ABNORMAL HIGH (ref 70–99)
Potassium: 4.4 meq/L (ref 3.5–5.1)
Sodium: 137 meq/L (ref 135–145)
Total Bilirubin: 0.5 mg/dL (ref 0.2–1.2)
Total Protein: 8.2 g/dL (ref 6.0–8.3)

## 2024-10-04 LAB — LIPID PANEL
Cholesterol: 197 mg/dL (ref 28–200)
HDL: 59.3 mg/dL
LDL Cholesterol: 96 mg/dL (ref 10–99)
NonHDL: 137.74
Total CHOL/HDL Ratio: 3
Triglycerides: 211 mg/dL — ABNORMAL HIGH (ref 10.0–149.0)
VLDL: 42.2 mg/dL — ABNORMAL HIGH (ref 0.0–40.0)

## 2024-10-04 LAB — CBC WITH DIFFERENTIAL/PLATELET
Basophils Absolute: 0 K/uL (ref 0.0–0.1)
Basophils Relative: 0.6 % (ref 0.0–3.0)
Eosinophils Absolute: 0.2 K/uL (ref 0.0–0.7)
Eosinophils Relative: 3.1 % (ref 0.0–5.0)
HCT: 43.6 % (ref 39.0–52.0)
Hemoglobin: 14.9 g/dL (ref 13.0–17.0)
Lymphocytes Relative: 35.7 % (ref 12.0–46.0)
Lymphs Abs: 2.3 K/uL (ref 0.7–4.0)
MCHC: 34.1 g/dL (ref 30.0–36.0)
MCV: 90.4 fl (ref 78.0–100.0)
Monocytes Absolute: 0.5 K/uL (ref 0.1–1.0)
Monocytes Relative: 8.1 % (ref 3.0–12.0)
Neutro Abs: 3.4 K/uL (ref 1.4–7.7)
Neutrophils Relative %: 52.5 % (ref 43.0–77.0)
Platelets: 278 K/uL (ref 150.0–400.0)
RBC: 4.82 Mil/uL (ref 4.22–5.81)
RDW: 13.3 % (ref 11.5–15.5)
WBC: 6.4 K/uL (ref 4.0–10.5)

## 2024-10-04 LAB — VITAMIN D 25 HYDROXY (VIT D DEFICIENCY, FRACTURES): VITD: 14.96 ng/mL — ABNORMAL LOW (ref 30.00–100.00)

## 2024-10-04 LAB — HEMOGLOBIN A1C: Hgb A1c MFr Bld: 5.8 % (ref 4.6–6.5)

## 2024-10-04 LAB — TSH: TSH: 1.73 u[IU]/mL (ref 0.35–5.50)

## 2024-10-04 NOTE — Progress Notes (Signed)
 " Phone: [phone]   Subjective:  Patient 59 y.o. male presenting for annual physical.  Chief Complaint  Patient presents with   Annual Exam    Pt is here for CPE    Discussed the use of AI scribe software for clinical note transcription with the patient, who gave verbal consent to proceed.  History of Present Illness Derek Kennedy is a 59 year old male who presents for an annual physical exam.  He has not undergone any new surgeries since his last visit a year ago. His current medications include rosuvastatin  10 mg, tadalafil  5 mg daily for benign prostatic hyperplasia, tamsulosin, and ipratropium. The ipratropium has effectively resolved his previous dry cough, but it has caused dry nasal passages with occasional epistaxis, which he manages with nasal saline several times a day.  He has gained ten pounds and is interested in discussing weight loss options. He is not currently exercising and consumes one to two beers daily, preferring dark beer. Wife on zepbound and doing well  No major headaches, dizziness, syncope, blurry vision, diplopia, sore throat, hoarseness, dysphagia, chest pain, palpitations, coughing, wheezing, dyspnea, vomiting, diarrhea, constipation, and depression. He reports some myalgias and arthralgias, particularly in his elbow from physical activity.  He has a history of benign polyps found during a colonoscopy performed shortly after turning [age] and is overdue for a follow-up. He has completed the shingles vaccination series but needs to provide the dates. He is due for a flu shot and a pneumonia shot.  He is still seeing a urologist who monitors his PSA levels. He has a history of low vitamin D  levels but is not currently taking supplements. He consumed a cup of cottage cheese and coffee around 6 AM today.    See problem oriented charting- ROS- ROS: Gen: no fever, chills  Skin: no rash, itching ENT: no ear pain, ear drainage, nasal congestion,  rhinorrhea, sinus pressure, sore throat Eyes: no blurry vision, double vision Resp: no cough, wheeze,SOB CV: no CP, palpitations, LE edema,  GI: no heartburn, n/v/d/c, abd pain GU: no dysuria, urgency, frequency, hematuria MSK: some Neuro: no dizziness, headache, weakness, vertigo Psych: no depression, anxiety, insomnia, SI   The following were reviewed and entered/updated in epic: Past Medical History:  Diagnosis Date   Allergy    BPH with obstruction/lower urinary tract symptoms    Cough 08/21/2013   GERD (gastroesophageal reflux disease)    GERD (gastroesophageal reflux disease) 08/21/2013   Hx of adenomatous colonic polyps 11/02/2015   Hyperlipidemia    Need for prophylactic vaccination and inoculation against influenza 08/21/2013   Other specified hypertrophic and atrophic condition of skin    Other specified hypertrophic and atrophic condition of skin    Sleep apnea    uses mouthpiece not cpap   Vitamin D  deficiency    Vitamin D  deficiency    Patient Active Problem List   Diagnosis Date Noted   Hypercholesterolemia 04/18/2024   Sleep apnea 05/06/2022   S/P inguinal hernia repair 04/18/2021   BPH (benign prostatic hyperplasia) 09/24/2017   Elevated PSA 09/24/2017   Hematuria 11/05/2015   Hx of adenomatous colonic polyps 11/02/2015   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   Past Surgical History:  Procedure Laterality Date   HERNIA REPAIR Right 2022   inguinal   TYMPANOSTOMY TUBE PLACEMENT  1971   WISDOM TOOTH EXTRACTION      Family History  Problem Relation Age of Onset   Hypertension Mother    Heart  disease Father 3   Hyperlipidemia Father    Heart attack Father    Kidney disease Father    Early death Sister    Arthritis Maternal Grandmother    Vision loss Maternal Grandmother    Heart attack Maternal Grandfather    Hearing loss Maternal Grandfather    Heart disease Maternal Grandfather    Heart attack Paternal Grandfather     Vascular Disease Paternal Grandfather    Heart disease Paternal Grandfather 73   Hyperlipidemia Paternal Grandfather    Cancer Paternal Uncle    Colon cancer Neg Hx     Medications- reviewed and updated Current Outpatient Medications  Medication Sig Dispense Refill   cyclobenzaprine  (FLEXERIL ) 10 MG tablet Take 1 tablet (10 mg total) by mouth 2 (two) times daily as needed for muscle spasms. 20 tablet 0   ipratropium (ATROVENT ) 0.06 % nasal spray Place 2 sprays into both nostrils 4 (four) times daily. 15 mL 12   rosuvastatin  (CRESTOR ) 10 MG tablet Take 1 tablet (10 mg total) by mouth daily. 90 tablet 3   tadalafil  (CIALIS ) 5 MG tablet Take 5 mg by mouth daily.     tamsulosin (FLOMAX) 0.4 MG CAPS capsule Take 0.4 mg by mouth daily.     No current facility-administered medications for this visit.    Allergies-reviewed and updated Allergies[1]  Social History   Social History Narrative   Marital Status: Married [name])Children:  [name]); Stepdaughter [name]) Pets:  None Living Situation: Lives with spouse .Occupation: Best [employer]) Education: He attended college at [employer] .    He currently does not use any tobacco products.  He has used smokeless tobacco in the past but quit in 2009 with the help of Chantix. Alcohol Use:  He rarely drinks during the week.  If he drinks it is usually on the weekends.Drug Use:  NoneDiet:  RegularExercise:  Yardwork Hobbies: Golf/ Wood Working    Objective  Objective:  BP 128/82 (BP Location: Left Arm, Patient Position: Sitting, Cuff Size: Large)   Pulse 86   Temp (!) 97.3 F (36.3 C) (Temporal)   Ht 6' 2 (1.88 m)   Wt 235 lb 4 oz (106.7 kg)   SpO2 98%   BMI 30.20 kg/m  Physical Exam  Gen: WDWN NAD HEENT: NCAT, conjunctiva not injected, sclera nonicteric TM WNL B, OP moist, no exudates  NECK:  supple, no thyromegaly, no nodes, no carotid bruits CARDIAC: RRR, S1S2+, no murmur. DP 2+B LUNGS: CTAB. No  wheezes ABDOMEN:  BS+, soft, NTND, No HSM, no masses EXT:  no edema MSK: no gross abnormalities. MS 5/5 all 4 NEURO: A&O x3.  CN II-XII intact.  PSYCH: normal mood. Good eye contact     Assessment and Plan   Health Maintenance counseling: 1. Anticipatory guidance: Patient counseled regarding regular dental exams q6 months, eye exams yearly, avoiding smoking and second hand smoke, limiting alcohol to 2 beverages per day.   2. Risk factor reduction:  Advised patient of need for regular exercise and diet rich in fruits and vegetables to reduce risk of heart attack and stroke. Exercise- encouraged.   Wt Readings from Last 3 Encounters:  10/04/24 235 lb 4 oz (106.7 kg)  04/18/24 224 lb (101.6 kg)  06/02/23 229 lb (103.9 kg)   3. Immunizations/screenings/ancillary studies Immunization History  Administered Date(s) Administered   Influenza, Seasonal, Injecte, Preservative Fre 10/04/2024   Influenza,inj,Quad PF,6+ Mos 06/09/2013, 07/24/2015, 08/20/2016, 08/26/2017, 07/11/2019, 07/12/2020   Influenza-Unspecified 06/09/2013, 07/24/2015,  08/20/2016   PFIZER Comirnaty(Gray Top)Covid-19 Tri-Sucrose Vaccine 04/02/2021   PFIZER(Purple Top)SARS-COV-2 Vaccination 12/01/2019, 12/22/2019, 08/06/2020   Pfizer Covid-19 Vaccine Bivalent Booster 37yrs & up 07/31/2021, 05/04/2022   Tdap 06/08/2008, 03/12/2021   There are no preventive care reminders to display for this patient.   4. Prostate cancer screening >55yo - risk factors?  Lab Results  Component Value Date   PSA 2.40 06/02/2023    5. Colon cancer screening:pt will sch 6. Skin cancer screening- Iadvised regular sunscreen use. Denies worrisome, changing, or new skin lesions.  7. Smoking associated screening (lung cancer screening, AAA screen 65-75, UA)- non smoker Wellness examination -     Lipid panel -     Comprehensive metabolic panel with GFR -     CBC with Differential/Platelet -     Hemoglobin A1c -     TSH -     VITAMIN D  25 Hydroxy  (Vit-D Deficiency, Fractures)  Hypercholesterolemia -     Lipid panel  Benign prostatic hyperplasia with urinary frequency  Immunization due -     Flu vaccine trivalent PF, 6mos and older(Flulaval,Afluria,Fluarix,Fluzone)  Class 1 obesity due to excess calories with serious comorbidity and body mass index (BMI) of 30.0 to 30.9 in adult  Vitamin D  deficiency -     VITAMIN D  25 Hydroxy (Vit-D Deficiency, Fractures)    Assessment and Plan Assessment & Plan Class 1 obesity   He has gained 10 pounds recently and is interested in weight loss medications, specifically Wegovy pills, inspired by his wife's success with Zepbound. The importance of lifestyle modifications, including diet and exercise, was discussed alongside medication. Potential side effects of weight loss medications, such as nausea and constipation, were explained, along with the need for a bowel regimen. Adequate nutrition and hydration were emphasized. Insurance coverage and cost considerations for weight loss medications were also discussed. A message will be sent via MyChart to check if [facility] pills can be prescribed. Implement a bowel regimen with Miralax, Colace, and fiber supplements. Reduce alcohol intake to aid weight loss. Schedule a follow-up in 3 months to assess progress.  Hypercholesterolemia   Managed with rosuvastatin  10 mg daily.  Benign prostatic hyperplasia with lower urinary tract symptoms   Benign prostatic hyperplasia is managed with tadalafil  5 mg daily and tamsulosin. Symptoms are well-controlled with the current medication regimen.  Vitamin D  deficiency   Noted without current supplementation. A vitamin D  level has been ordered to assess current status.  General health maintenance   He is due for a colonoscopy following previous abnormal findings with benign polyps. Shingles vaccination is completed. Pneumonia vaccination is pending, and flu vaccination is due. Contact GI to schedule a colonoscopy.  Administer pneumonia and flu vaccinations.     Recommended follow up: Return in about 3 months (around 01/02/2025) for wt.  Lab/Order associations:ate 4 hrs ago-cottage cheese fasting   Jenkins CHRISTELLA Carrel, MD     [1] No Known Allergies  "

## 2024-10-04 NOTE — Patient Instructions (Addendum)
 It was very nice to see you today!  Fiber, water, colace 100-200mg  daily, miralax  [facility] [phone]  Get dates of shingles immunizations  Message later this week for wegovy pills   PLEASE NOTE:  If you had any lab tests please let us  know if you have not heard back within a few days. You may see your results on MyChart before we have a chance to review them but we will give you a call once they are reviewed by us . If we ordered any referrals today, please let us  know if you have not heard from their office within the next week.   Please try these tips to maintain a healthy lifestyle:  Eat most of your calories during the day when you are active. Eliminate processed foods including packaged sweets (pies, cakes, cookies), reduce intake of potatoes, white bread, white pasta, and white rice. Look for whole grain options, oat flour or almond flour.  Each meal should contain half fruits/vegetables, one quarter protein, and one quarter carbs (no bigger than a computer mouse).  Cut down on sweet beverages. This includes juice, soda, and sweet tea. Also watch fruit intake, though this is a healthier sweet option, it still contains natural sugar! Limit to 3 servings daily.  Drink at least 1 glass of water with each meal and aim for at least 8 glasses per day  Exercise at least 150 minutes every week.

## 2024-10-04 NOTE — Progress Notes (Signed)
 Labs ok except  Trigs elevated-wasn't fasting.  Keep working on diet/exercise and taking crestor  Vitamin D  low-if agrees, send in 50,000iu/week #12/1 A1C(3 month average of sugars) is elevated.  This is considered PreDiabetes.  Work on diet-decrease sugars and starches and aim for 30 minutes of exercise 5 days/week to prevent progression to diabetes

## 2024-10-06 ENCOUNTER — Other Ambulatory Visit: Payer: Self-pay

## 2024-10-06 MED ORDER — VITAMIN D (ERGOCALCIFEROL) 1.25 MG (50000 UNIT) PO CAPS: 50000.0000 [IU] | ORAL_CAPSULE | ORAL | 1 refills | Status: AC

## 2024-10-12 ENCOUNTER — Encounter: Payer: Self-pay | Admitting: Family Medicine

## 2024-10-14 ENCOUNTER — Other Ambulatory Visit: Payer: Self-pay | Admitting: Family Medicine

## 2024-10-14 MED ORDER — WEGOVY 1.5 MG PO TABS: 1.5000 mg | ORAL_TABLET | Freq: Every day | ORAL | 0 refills | Status: AC

## 2025-01-05 ENCOUNTER — Ambulatory Visit: Admitting: Family Medicine
